# Patient Record
Sex: Female | Born: 1970 | Race: Black or African American | Hispanic: No | Smoking: Current every day smoker
Health system: Southern US, Community
[De-identification: ages and names within clinical notes are randomized; demographics above are authoritative.]

## PROBLEM LIST (undated history)

## (undated) DIAGNOSIS — F329 Major depressive disorder, single episode, unspecified: Secondary | ICD-10-CM

## (undated) DIAGNOSIS — F419 Anxiety disorder, unspecified: Secondary | ICD-10-CM

## (undated) DIAGNOSIS — D649 Anemia, unspecified: Secondary | ICD-10-CM

## (undated) DIAGNOSIS — R569 Unspecified convulsions: Secondary | ICD-10-CM

## (undated) DIAGNOSIS — F1010 Alcohol abuse, uncomplicated: Secondary | ICD-10-CM

## (undated) DIAGNOSIS — F4310 Post-traumatic stress disorder, unspecified: Secondary | ICD-10-CM

## (undated) DIAGNOSIS — F3181 Bipolar II disorder: Secondary | ICD-10-CM

## (undated) HISTORY — PX: LUNG LOBECTOMY: SHX167

## (undated) HISTORY — PX: APPENDECTOMY: SHX54

## (undated) HISTORY — PX: OTHER SURGICAL HISTORY: SHX169

## (undated) HISTORY — PX: HERNIA REPAIR: SHX51

## (undated) HISTORY — PX: ABDOMINAL HYSTERECTOMY: SHX81

---
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
## 2002-03-14 DIAGNOSIS — F419 Anxiety disorder, unspecified: Secondary | ICD-10-CM

## 2002-03-14 DIAGNOSIS — F32A Depression, unspecified: Secondary | ICD-10-CM

## 2002-03-14 HISTORY — DX: Anxiety disorder, unspecified: F41.9

## 2002-03-14 HISTORY — DX: Depression, unspecified: F32.A

## 2006-03-14 HISTORY — PX: DILATION AND CURETTAGE OF UTERUS: SHX78

## 2008-10-13 ENCOUNTER — Ambulatory Visit: Payer: Self-pay | Admitting: Radiology

## 2008-10-13 ENCOUNTER — Emergency Department (HOSPITAL_BASED_OUTPATIENT_CLINIC_OR_DEPARTMENT_OTHER): Admission: EM | Admit: 2008-10-13 | Discharge: 2008-10-13 | Payer: Self-pay | Admitting: Emergency Medicine

## 2008-11-05 ENCOUNTER — Emergency Department (HOSPITAL_BASED_OUTPATIENT_CLINIC_OR_DEPARTMENT_OTHER): Admission: EM | Admit: 2008-11-05 | Discharge: 2008-11-05 | Payer: Self-pay | Admitting: Emergency Medicine

## 2010-07-05 HISTORY — PX: GASTRIC BYPASS: SHX52

## 2010-11-17 ENCOUNTER — Encounter: Payer: Self-pay | Admitting: *Deleted

## 2010-11-17 ENCOUNTER — Emergency Department (INDEPENDENT_AMBULATORY_CARE_PROVIDER_SITE_OTHER): Payer: Self-pay

## 2010-11-17 ENCOUNTER — Emergency Department (HOSPITAL_BASED_OUTPATIENT_CLINIC_OR_DEPARTMENT_OTHER)
Admission: EM | Admit: 2010-11-17 | Discharge: 2010-11-17 | Disposition: A | Discharge status: Home / Self Care | Payer: Self-pay | Attending: Emergency Medicine | Admitting: Emergency Medicine

## 2010-11-17 DIAGNOSIS — Z9884 Bariatric surgery status: Secondary | ICD-10-CM

## 2010-11-17 DIAGNOSIS — R109 Unspecified abdominal pain: Secondary | ICD-10-CM

## 2010-11-17 DIAGNOSIS — N390 Urinary tract infection, site not specified: Secondary | ICD-10-CM

## 2010-11-17 LAB — COMPREHENSIVE METABOLIC PANEL
AST: 14 U/L (ref 0–37)
BUN: 10 mg/dL (ref 6–23)
CO2: 24 mEq/L (ref 19–32)
Calcium: 9.6 mg/dL (ref 8.4–10.5)
Chloride: 103 mEq/L (ref 96–112)
Creatinine, Ser: 0.6 mg/dL (ref 0.50–1.10)
GFR calc Af Amer: >60 mL/min (ref >60)
GFR calc non Af Amer: >60 mL/min (ref >60)
Glucose, Bld: 96 mg/dL (ref 70–99)
Total Bilirubin: 0.3 mg/dL (ref 0.3–1.2)

## 2010-11-17 LAB — URINALYSIS, ROUTINE W REFLEX MICROSCOPIC
Nitrite: POSITIVE — AB
Protein, ur: NEGATIVE mg/dL
Specific Gravity, Urine: 1.023 (ref 1.005–1.030)
Urobilinogen, UA: 1 mg/dL (ref 0.0–1.0)

## 2010-11-17 LAB — CBC
HCT: 31.9 % — ABNORMAL LOW (ref 36.0–46.0)
Hemoglobin: 10.9 g/dL — ABNORMAL LOW (ref 12.0–15.0)
MCHC: 34.2 g/dL (ref 30.0–36.0)
MCV: 85.1 fL (ref 78.0–100.0)
RDW: 16.6 % — ABNORMAL HIGH (ref 11.5–15.5)
WBC: 12.6 10*3/uL — ABNORMAL HIGH (ref 4.0–10.5)

## 2010-11-17 LAB — DIFFERENTIAL
Basophils Absolute: 0 10*3/uL (ref 0.0–0.1)
Eosinophils Relative: 1 % (ref 0–5)
Lymphocytes Relative: 19 % (ref 12–46)
Monocytes Absolute: 0.9 10*3/uL (ref 0.1–1.0)
Monocytes Relative: 7 % (ref 3–12)

## 2010-11-17 LAB — LIPASE, BLOOD: Lipase: 12 U/L (ref 11–59)

## 2010-11-17 LAB — URINE MICROSCOPIC-ADD ON

## 2010-11-17 MED ORDER — HYDROCODONE-ACETAMINOPHEN 5-325 MG PO TABS: 2.0000 | ORAL_TABLET | ORAL | Status: AC | PRN

## 2010-11-17 MED ORDER — HYDROMORPHONE HCL 1 MG/ML IJ SOLN
1.0000 mg | Freq: Once | INTRAMUSCULAR | Status: AC
  Administered 2010-11-17: 1 mg via INTRAVENOUS
  Filled 2010-11-17: qty 1

## 2010-11-17 MED ORDER — ONDANSETRON HCL 4 MG/2ML IJ SOLN
4.0000 mg | Freq: Once | INTRAMUSCULAR | Status: AC
  Administered 2010-11-17: 4 mg via INTRAVENOUS
  Filled 2010-11-17: qty 2

## 2010-11-17 MED ORDER — DIPHENHYDRAMINE HCL 50 MG/ML IJ SOLN
50.0000 mg | Freq: Once | INTRAMUSCULAR | Status: AC
  Administered 2010-11-17: 50 mg via INTRAVENOUS
  Filled 2010-11-17: qty 1

## 2010-11-17 MED ORDER — SODIUM CHLORIDE 0.9 % IV SOLN: 999.0000 mL | Freq: Once | INTRAVENOUS | Status: DC

## 2010-11-17 MED ORDER — IOHEXOL 300 MG/ML  SOLN
100.0000 mL | Freq: Once | INTRAMUSCULAR | Status: AC | PRN
  Administered 2010-11-17: 100 mL via INTRAVENOUS

## 2010-11-17 MED ORDER — SULFAMETHOXAZOLE-TRIMETHOPRIM 800-160 MG PO TABS: 1.0000 | ORAL_TABLET | Freq: Two times a day (BID) | ORAL | Status: AC

## 2010-11-17 MED ORDER — HYDROCORTISONE SOD SUCCINATE 100 MG IJ SOLR
200.0000 mg | Freq: Once | INTRAMUSCULAR | Status: AC
  Administered 2010-11-17: 200 mg via INTRAVENOUS
  Filled 2010-11-17 (×2): qty 2

## 2010-11-17 NOTE — ED Notes (Signed)
Patient states she had gastric bypass on July 05, 2010. Patient states pain in abdominal pain and n/v and denies fever.

## 2010-11-17 NOTE — ED Provider Notes (Signed)
History     CSN: [id number] Arrival date & time: 11/17/2010 10:35 AM  Chief Complaint  Patient presents with  . Abdominal Pain   Patient is a 40 y.o. female presenting with abdominal pain. The history is provided by the patient.  Abdominal Pain The primary symptoms of the illness include abdominal pain. The current episode started 2 days ago. The onset of the illness was sudden. The problem has been gradually worsening.  The illness is associated with a recent illness. The patient states that she believes she is currently not pregnant. Risk factors for an acute abdominal problem include a history of abdominal surgery. Additional symptoms associated with the illness include chills, anorexia and urgency. Significant associated medical issues do not include PUD, GERD, inflammatory bowel disease, diabetes or gallstones.  Pt complains of upper abdominal pain. Pt reports she had a a gastric bypass in April in [place].    History reviewed. No pertinent past medical history.  Past Surgical History  Procedure Date  . Appendectomy   . C sections x 2   . Gastric bypass July 05, 2010  . Hernia repair   . Tonsillectomy     History reviewed. No pertinent family history.  History  Substance Use Topics  . Smoking status: Smoker, Current Status Unknown  . Smokeless tobacco: Never Used  . Alcohol Use: No    OB History    Grav Para Term Preterm Abortions TAB SAB Ect Mult Living                  Review of Systems  Constitutional: Positive for chills.  Gastrointestinal: Positive for abdominal pain and anorexia.  Genitourinary: Positive for urgency.  All other systems reviewed and are negative.    Physical Exam  BP 115/71  Pulse 100  Temp(Src) 97.9 F (36.6 C) (Oral)  Resp 20  Ht 5\' 9"  (1.753 m)  Wt 182 lb 9 oz (82.81 kg)  BMI 26.96 kg/m2  SpO2 99%  LMP 10/21/2010  Physical Exam  Nursing note and vitals reviewed. Constitutional: She is oriented to person, place, and time. She  appears well-developed and well-nourished.  HENT:  Head: Normocephalic and atraumatic.  Eyes: Conjunctivae and EOM are normal. Pupils are equal, round, and reactive to light.  Neck: Normal range of motion. Neck supple.  Cardiovascular: Normal rate.   Abdominal: Soft. There is tenderness. There is guarding.  Musculoskeletal: Normal range of motion.  Neurological: She is alert and oriented to person, place, and time.  Skin: Skin is warm and dry.  Psychiatric: She has a normal mood and affect.    ED Course  Procedures  MDM Ct no acute abnormality,  Pt has uti.  Dr. Fredricka Bonine in to see and exam pt.  Pt given rx for hydrocodone and bactrim.  Pt is advised to see her MD for recheck,.      Langston Masker, [place] 11/17/10 1527

## 2010-11-29 NOTE — ED Provider Notes (Signed)
Evaluation and management procedures were performed by the PA/NP under my supervision/collaboration.    Delano Frate D Nashalie Sallis, MD 11/29/10 1308 

## 2012-01-23 ENCOUNTER — Encounter (HOSPITAL_COMMUNITY): Payer: Self-pay | Admitting: *Deleted

## 2012-01-23 ENCOUNTER — Emergency Department (HOSPITAL_COMMUNITY)
Admission: EM | Admit: 2012-01-23 | Discharge: 2012-01-26 | Disposition: A | Discharge status: Other Acute Inpatient Hospital | Attending: Emergency Medicine | Admitting: Emergency Medicine

## 2012-01-23 DIAGNOSIS — F1910 Other psychoactive substance abuse, uncomplicated: Secondary | ICD-10-CM | POA: Diagnosis present

## 2012-01-23 DIAGNOSIS — D649 Anemia, unspecified: Secondary | ICD-10-CM

## 2012-01-23 DIAGNOSIS — Z9884 Bariatric surgery status: Secondary | ICD-10-CM

## 2012-01-23 DIAGNOSIS — Z531 Procedure and treatment not carried out because of patient's decision for reasons of belief and group pressure: Secondary | ICD-10-CM | POA: Insufficient documentation

## 2012-01-23 DIAGNOSIS — F329 Major depressive disorder, single episode, unspecified: Secondary | ICD-10-CM | POA: Diagnosis present

## 2012-01-23 DIAGNOSIS — IMO0001 Reserved for inherently not codable concepts without codable children: Secondary | ICD-10-CM

## 2012-01-23 DIAGNOSIS — D509 Iron deficiency anemia, unspecified: Secondary | ICD-10-CM | POA: Diagnosis present

## 2012-01-23 DIAGNOSIS — F32A Depression, unspecified: Secondary | ICD-10-CM | POA: Diagnosis present

## 2012-01-23 DIAGNOSIS — F1420 Cocaine dependence, uncomplicated: Secondary | ICD-10-CM | POA: Insufficient documentation

## 2012-01-23 DIAGNOSIS — F3289 Other specified depressive episodes: Secondary | ICD-10-CM | POA: Insufficient documentation

## 2012-01-23 LAB — CBC WITH DIFFERENTIAL/PLATELET
Eosinophils Relative: 2 % (ref 0–5)
Lymphocytes Relative: 36 % (ref 12–46)
Monocytes Absolute: 0.6 10*3/uL (ref 0.1–1.0)
Monocytes Relative: 8 % (ref 3–12)
Neutrophils Relative %: 53 % (ref 43–77)
Platelets: 838 10*3/uL — ABNORMAL HIGH (ref 150–400)
RBC: 3.44 MIL/uL — ABNORMAL LOW (ref 3.87–5.11)
WBC: 7.1 10*3/uL (ref 4.0–10.5)

## 2012-01-23 LAB — URINALYSIS, ROUTINE W REFLEX MICROSCOPIC
Nitrite: POSITIVE — AB
Specific Gravity, Urine: 1.022 (ref 1.005–1.030)
Urobilinogen, UA: 1 mg/dL (ref 0.0–1.0)

## 2012-01-23 LAB — URINE MICROSCOPIC-ADD ON

## 2012-01-23 LAB — BASIC METABOLIC PANEL
BUN: 8 mg/dL (ref 6–23)
GFR calc Af Amer: >90 mL/min (ref >90)
GFR calc non Af Amer: >90 mL/min (ref >90)
Potassium: 3.7 mEq/L (ref 3.5–5.1)
Sodium: 138 mEq/L (ref 135–145)

## 2012-01-23 LAB — RAPID URINE DRUG SCREEN, HOSP PERFORMED
Barbiturates: NOT DETECTED
Opiates: NOT DETECTED
Tetrahydrocannabinol: POSITIVE — AB

## 2012-01-23 LAB — OCCULT BLOOD, POC DEVICE: Fecal Occult Bld: NEGATIVE

## 2012-01-23 NOTE — ED Notes (Signed)
Pt is in blue scrubs  And has been wanded

## 2012-01-23 NOTE — ED Notes (Signed)
ACT at bedside 

## 2012-01-23 NOTE — ED Notes (Signed)
Patient states SI with no plan. Intentionally puts herself into situations where she could be killed. Patient states that she "wants someone else to do it".

## 2012-01-23 NOTE — ED Notes (Signed)
Patients purse sent home with (Alicia Zamora) family friend. This is per patient request. Patient personally handed purse to [place]. All actions witnessed by sitter. Patient would like Suzette Battiest to be her emergency contact [phone]

## 2012-01-23 NOTE — ED Notes (Signed)
Pt is here with suicidal thoughts and homocidal thoughts.  PT reports starvation, use of crack cocaine, isolation.  No ingestion or self harm physical

## 2012-01-24 LAB — HEMOGLOBIN AND HEMATOCRIT, BLOOD: Hemoglobin: 7 g/dL — ABNORMAL LOW (ref 12.0–15.0)

## 2012-01-24 MED ORDER — URSODIOL 500 MG PO TABS: 500.0000 mg | ORAL_TABLET | Freq: Two times a day (BID) | ORAL | Status: DC

## 2012-01-24 MED ORDER — BUSPIRONE HCL 15 MG PO TABS
15.0000 mg | ORAL_TABLET | Freq: Three times a day (TID) | ORAL | Status: DC | PRN
  Filled 2012-01-24: qty 1

## 2012-01-24 MED ORDER — PANTOPRAZOLE SODIUM 40 MG PO TBEC: 80.0000 mg | DELAYED_RELEASE_TABLET | Freq: Every day | ORAL | Status: DC

## 2012-01-24 MED ORDER — PANTOPRAZOLE SODIUM 40 MG PO TBEC
40.0000 mg | DELAYED_RELEASE_TABLET | Freq: Every day | ORAL | Status: DC
  Administered 2012-01-24: 40 mg via ORAL
  Filled 2012-01-24: qty 1

## 2012-01-24 MED ORDER — ONE-DAILY MULTI VITAMINS PO TABS: 1.0000 | ORAL_TABLET | Freq: Two times a day (BID) | ORAL | Status: DC

## 2012-01-24 MED ORDER — DIAZEPAM 5 MG PO TABS
10.0000 mg | ORAL_TABLET | Freq: Three times a day (TID) | ORAL | Status: DC | PRN
  Administered 2012-01-24 – 2012-01-26 (×4): 10 mg via ORAL
  Filled 2012-01-24: qty 1
  Filled 2012-01-24 (×3): qty 2
  Filled 2012-01-24: qty 1

## 2012-01-24 MED ORDER — FERROUS SULFATE 325 (65 FE) MG PO TABS
325.0000 mg | ORAL_TABLET | Freq: Three times a day (TID) | ORAL | Status: DC
  Administered 2012-01-24 – 2012-01-26 (×6): 325 mg via ORAL
  Filled 2012-01-24 (×12): qty 1

## 2012-01-24 MED ORDER — TEMAZEPAM 7.5 MG PO CAPS
7.5000 mg | ORAL_CAPSULE | Freq: Every evening | ORAL | Status: DC | PRN
  Administered 2012-01-24 – 2012-01-25 (×2): 7.5 mg via ORAL
  Filled 2012-01-24 (×2): qty 1

## 2012-01-24 MED ORDER — CIPROFLOXACIN HCL 500 MG PO TABS
500.0000 mg | ORAL_TABLET | Freq: Once | ORAL | Status: AC
  Administered 2012-01-24: 500 mg via ORAL
  Filled 2012-01-24: qty 1

## 2012-01-24 MED ORDER — ADULT MULTIVITAMIN W/MINERALS CH
1.0000 | ORAL_TABLET | Freq: Two times a day (BID) | ORAL | Status: DC
  Administered 2012-01-24 – 2012-01-26 (×5): 1 via ORAL
  Filled 2012-01-24 (×5): qty 1

## 2012-01-24 MED ORDER — URSODIOL 500 MG PO TABS
500.0000 mg | ORAL_TABLET | Freq: Two times a day (BID) | ORAL | Status: DC
  Filled 2012-01-24 (×2): qty 1

## 2012-01-24 MED ORDER — VITAMIN B-1 100 MG PO TABS
50.0000 mg | ORAL_TABLET | Freq: Every day | ORAL | Status: DC
  Administered 2012-01-24 – 2012-01-26 (×3): 50 mg via ORAL
  Filled 2012-01-24 (×3): qty 1

## 2012-01-24 MED ORDER — PANTOPRAZOLE SODIUM 40 MG PO TBEC
40.0000 mg | DELAYED_RELEASE_TABLET | Freq: Two times a day (BID) | ORAL | Status: DC
  Administered 2012-01-24 – 2012-01-26 (×5): 40 mg via ORAL
  Filled 2012-01-24 (×5): qty 1

## 2012-01-24 MED ORDER — VITAMIN D (ERGOCALCIFEROL) 1.25 MG (50000 UNIT) PO CAPS: 50000.0000 [IU] | ORAL_CAPSULE | ORAL | Status: DC

## 2012-01-24 MED ORDER — VITAMIN B-12 1000 MCG PO TABS
1000.0000 ug | ORAL_TABLET | ORAL | Status: DC
  Administered 2012-01-26: 1000 ug via ORAL
  Filled 2012-01-24: qty 1

## 2012-01-24 MED ORDER — VITAMIN B-12 1000 MCG/15ML PO LIQD: 15.0000 mL | ORAL | Status: DC

## 2012-01-24 MED ORDER — CALCIUM CITRATE 950 (200 CA) MG PO TABS
1.0000 | ORAL_TABLET | Freq: Two times a day (BID) | ORAL | Status: DC
  Administered 2012-01-24 – 2012-01-26 (×5): 200 mg via ORAL
  Filled 2012-01-24 (×7): qty 1

## 2012-01-24 NOTE — ED Notes (Signed)
telepsych completed 
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
## 2012-01-24 NOTE — ED Notes (Signed)
Pt complaining of acid reflux, hx of the same, med was last given at 1245 this afternoon, EDP is aware and will give pt a dose at this time. Pt requesting something to help her sleep. Will give PRN meds as ordered. Sitter is at bedside. Pt is calm and cooperative and pleasant. Pt in scrubs. Area is safe. Pt watching tv with no other needs.

## 2012-01-24 NOTE — ED Notes (Signed)
Pt sleeping, NAD, calm, door open, pt visualized.

## 2012-01-24 NOTE — ED Notes (Addendum)
Sleeping, NAD, calm, door open, pt visualized.

## 2012-01-24 NOTE — ED Notes (Signed)
Bed switched out and pt given hospital bed for comfort. Pt eating at this time.

## 2012-01-24 NOTE — ED Provider Notes (Signed)
Pt had telepsych consult by Dr Jacky Kindle who feels patient has major depressive disorder, recurrent, severe without psychotic behavior and recommends inpatient psychiatric care. Pt relates multiple deaths in her family. No medication recommendation given.   Ward Givens, MD 01/24/12 [phone]

## 2012-01-24 NOTE — ED Notes (Signed)
ACT team inquiring about anemia and UTI.  Chart reviewed.  It does not appear that patient was started on any antibiotics as of yet, urine culture sent.  Allergies reviewed- PCN allergic, so started on cipro, will need BID x 3 days.  Pt anemic- hemoccult negative.  Does have GERD and is requesting her protonix to be given BID- this was ordered.  Also ordered ferrous sulfate TID.  Pt states she has hx of anemia due to prior gastric bypass.  She is not tachycardic or orthostatic.    Ethelda Chick, MD 01/24/12 [phone]

## 2012-01-24 NOTE — ED Notes (Addendum)
Sleeping, NAD, calm, door open, pt visualized. 

## 2012-01-24 NOTE — ED Notes (Signed)
Pt aaox4 ate breakfast and then up to shower states has special needs  For food which was requested for lunch

## 2012-01-24 NOTE — ED Notes (Signed)
Pt awake, given sprite per request, alert, NAD, calm, interactive, polite, pleasant, updated.

## 2012-01-25 DIAGNOSIS — Z531 Procedure and treatment not carried out because of patient's decision for reasons of belief and group pressure: Secondary | ICD-10-CM

## 2012-01-25 DIAGNOSIS — D509 Iron deficiency anemia, unspecified: Secondary | ICD-10-CM | POA: Diagnosis present

## 2012-01-25 DIAGNOSIS — F329 Major depressive disorder, single episode, unspecified: Secondary | ICD-10-CM

## 2012-01-25 DIAGNOSIS — Z9884 Bariatric surgery status: Secondary | ICD-10-CM

## 2012-01-25 DIAGNOSIS — D649 Anemia, unspecified: Secondary | ICD-10-CM

## 2012-01-25 DIAGNOSIS — F1910 Other psychoactive substance abuse, uncomplicated: Secondary | ICD-10-CM | POA: Diagnosis present

## 2012-01-25 LAB — RETICULOCYTES: Retic Count, Absolute: 30.1 10*3/uL (ref 19.0–186.0)

## 2012-01-25 LAB — CBC
HCT: 24 % — ABNORMAL LOW (ref 36.0–46.0)
Hemoglobin: 7.2 g/dL — ABNORMAL LOW (ref 12.0–15.0)
MCHC: 30 g/dL (ref 30.0–36.0)
RBC: 3.31 MIL/uL — ABNORMAL LOW (ref 3.87–5.11)

## 2012-01-25 LAB — IRON AND TIBC
Iron: <10 ug/dL — ABNORMAL LOW (ref 42–135)
UIBC: 326 ug/dL (ref 125–400)

## 2012-01-25 LAB — URINE CULTURE

## 2012-01-25 LAB — VITAMIN B12: Vitamin B-12: 342 pg/mL (ref 211–911)

## 2012-01-25 MED ORDER — DOCUSATE SODIUM 100 MG PO CAPS
100.0000 mg | ORAL_CAPSULE | Freq: Two times a day (BID) | ORAL | Status: DC
  Filled 2012-01-25 (×2): qty 1

## 2012-01-25 MED ORDER — SODIUM CHLORIDE 0.9 % IV SOLN
1000.0000 mg | Freq: Once | INTRAVENOUS | Status: AC
  Administered 2012-01-25: 1000 mg via INTRAVENOUS
  Filled 2012-01-25 (×2): qty 20

## 2012-01-25 MED ORDER — DOCUSATE SODIUM 100 MG PO CAPS
100.0000 mg | ORAL_CAPSULE | Freq: Two times a day (BID) | ORAL | Status: DC
  Administered 2012-01-25 – 2012-01-26 (×2): 100 mg via ORAL
  Filled 2012-01-25 (×2): qty 1

## 2012-01-25 MED ORDER — SODIUM CHLORIDE 0.9 % IV SOLN
25.0000 mg | Freq: Once | INTRAVENOUS | Status: AC
  Administered 2012-01-25: 25 mg via INTRAVENOUS
  Filled 2012-01-25: qty 0.5

## 2012-01-25 NOTE — ED Notes (Signed)
Pt is a Fish farm manager witness, therefore she will not consent to blood products or type and screen.

## 2012-01-25 NOTE — BH Assessment (Deleted)
[facility] Assessment Progress Note      Refaxed referral to [facility].  Spoke to [facility] [place] who reports they have female general psych beds.  Faxed referral.

## 2012-01-25 NOTE — ED Provider Notes (Signed)
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 [facility] concerned about low Hgb, although patient is heme positive and essentially asymptomatic. She is also a Jehovah's Witness and will not accept blood products. Will send Anemia Panel and request formal Hospitalist Consult to facilitate placement.   Charles B. Bernette Mayers, MD 01/25/12 [phone]

## 2012-01-25 NOTE — ED Notes (Signed)
Waiting for iron infusion to arrive from pharmacy. No needs at this time. Sitter remains at bedside. IV access obtained.

## 2012-01-25 NOTE — Consult Note (Signed)
[facility] Medical Consultation  Alicia Zamora VQQ:[id number] DOB: 02-15-1971 DOA: 01/23/2012 PCP: Sheila Oats, MD   Requesting physician: Bonnita Levan. Bernette Mayers Date of consultation: 01/25/2012 Reason for consultation: Anemia  Impression/Recommendations Active Problems:  Anemia  Refusal of blood transfusions as patient is Jehovah's Witness  S/P gastric bypass  Depression   Anemia -Patient has a history of gastric bypass surgery, these patients usually have high risk of nutritional deficiencies. -Other reasons can cause hypochromic microcytic anemia is chronic blood loss. -Patient already has negative FOBT. -Anemia panel is pending, If the ferritin and iron are low I will probably give IV iron. -Patient is Jehovah's Witness and she refused blood products transfusion. -Patient is mildly symptomatic from her anemia, suggesting chronicity of her anemia.  Depression -[facility] is [facility].   I will followup again tomorrow. Please contact me if I can be of assistance in the meanwhile. Thank you for this consultation.  Chief Complaint:  Anemia   HPI:   Alicia Zamora is a 41 year old African American female with past medical history of morbid obesity status post gastric bypass surgery in April of 2012. Patient came in to the hospital because of severe depression and homicidal thoughts. Patient was held in the emergency department and seen by psychiatry and recommended inpatient psychiatric placement. While she was in the emergency department her blood work showed significant anemia, patient mentioned also that she'll occasional dizziness. She denies any chest pain, shortness of breath or fatigability. Her hemoglobin was 10.9 on 11/17/2010. Hemoglobin tested to 7.7 the day before yesterday, and 7.0 yesterday. [facility] hospital[facility] being consulted for further management, please note this patient is Jehovah's Witness and she refused any  blood products transfusion.  Review of Systems:  Constitutional: Complaining about occasional dizziness  Eyes: negative for irritation, redness and visual disturbance Ears, nose, mouth, throat, and face: negative for earaches, epistaxis, nasal congestion and sore throat Respiratory: negative for cough, dyspnea on exertion, sputum and wheezing Cardiovascular: negative for chest pain, dyspnea, lower extremity edema, orthopnea, palpitations and syncope Gastrointestinal: negative for abdominal pain, constipation, diarrhea, melena, nausea and vomiting Genitourinary:negative for dysuria, frequency and hematuria Hematologic/lymphatic: negative for bleeding, easy bruising and lymphadenopathy Musculoskeletal:negative for arthralgias, muscle weakness and stiff joints Neurological: negative for coordination problems, gait problems, headaches and weakness Endocrine: negative for diabetic symptoms including polydipsia, polyuria and weight loss Allergic/Immunologic: negative for anaphylaxis, hay fever and urticaria   History reviewed. No pertinent past medical history. Past Surgical History  Procedure Date  . Appendectomy   . C sections x 2   . Gastric bypass July 05, 2010  . Hernia repair   . Tonsillectomy    Social History:  reports that she has been smoking.  She has never used smokeless tobacco. She reports that she uses illicit drugs. She reports that she does not drink alcohol.  Allergies  Allergen Reactions  . Iodine Anaphylaxis  . Penicillins Anaphylaxis  . Ketorolac Tromethamine Hives  . Nsaids     Due to gastric bypass   No family history of colon cancer  Prior to Admission medications   Medication Sig Start Date End Date Taking? Authorizing Provider  busPIRone (BUSPAR) 15 MG tablet Take 15 mg by mouth 3 (three) times daily as needed. For anxiety   Yes Historical Provider, MD  calcium citrate (CALCITRATE - DOSED IN MG ELEMENTAL CALCIUM) 950 MG tablet Take 1 tablet by mouth 2  (two) times daily.    Yes Historical Provider, MD  Cyanocobalamin (  VITAMIN B-12) 1000 MCG/15ML LIQD Take 15 mLs by mouth 2 (two) times a week.   Yes Historical Provider, MD  diazepam (VALIUM) 10 MG tablet Take 10 mg by mouth every 6 (six) hours as needed. For anxiety   Yes Historical Provider, MD  esomeprazole (NEXIUM) 40 MG capsule Take 40 mg by mouth 2 (two) times daily.    Yes Historical Provider, MD  Multiple Vitamin (MULTIVITAMIN) tablet Take 1 tablet by mouth 2 (two) times daily.    Yes Historical Provider, MD  temazepam (RESTORIL) 7.5 MG capsule Take 7.5 mg by mouth at bedtime as needed. For insomnia   Yes Historical Provider, MD  thiamine (VITAMIN B-1) 50 MG tablet Take 50 mg by mouth daily.   Yes Historical Provider, MD  ursodiol (ACTIGALL) 500 MG tablet Take 500 mg by mouth 2 (two) times daily.   Yes Historical Provider, MD  Vitamin D, Ergocalciferol, (DRISDOL) 50000 UNITS CAPS Take 50,000 Units by mouth 2 (two) times a week. mon & fri   Yes Historical Provider, MD   Physical Exam: Blood pressure 111/48, pulse 80, temperature 98.3 F (36.8 C), temperature source Oral, resp. rate 16, last menstrual period 01/11/2012, SpO2 100.00%. Filed Vitals:   01/24/12 2142 01/24/12 2145 01/24/12 2146 01/25/12 0632  BP: 105/63 114/71 104/62 111/48  Pulse: 100 104 105 80  Temp:    98.3 F (36.8 C)  TempSrc:    Oral  Resp:    16  SpO2:    100%   General appearance: alert, cooperative and no distress  Head: Normocephalic, without obvious abnormality, atraumatic  Eyes: conjunctivae/corneas clear. PERRL, EOM's intact. Fundi benign.  Nose: Nares normal. Septum midline. Mucosa normal. No drainage or sinus tenderness.  Throat: lips, mucosa, and tongue normal; teeth and gums normal  Neck: Supple, no masses, no cervical lymphadenopathy, no JVD appreciated, no meningeal signs Resp: clear to auscultation bilaterally  Chest wall: no tenderness  Cardio: regular rate and rhythm, S1, S2 normal, no murmur,  click, rub or gallop  GI: soft, non-tender; bowel sounds normal; no masses, no organomegaly  Extremities: extremities normal, atraumatic, no cyanosis or edema  Skin: Skin color, texture, turgor normal. No rashes or lesions  Neurologic: Alert and oriented X 3, normal strength and tone. Normal symmetric reflexes. Normal coordination and gait  Labs on Admission:  Basic Metabolic Panel:  Lab 01/23/12 1610  NA 138  K 3.7  CL 102  CO2 27  GLUCOSE 105*  BUN 8  CREATININE 0.62  CALCIUM 8.9  MG --  PHOS --   Liver Function Tests: No results found for this basename: AST:5,ALT:5,ALKPHOS:5,BILITOT:5,PROT:5,ALBUMIN:5 in the last 168 hours No results found for this basename: LIPASE:5,AMYLASE:5 in the last 168 hours No results found for this basename: AMMONIA:5 in the last 168 hours CBC:  Lab 01/24/12 2129 01/23/12 1815  WBC -- 7.1  NEUTROABS -- 3.7  HGB 7.0* 7.7*  HCT 23.0* 25.1*  MCV -- 73.0*  PLT -- 838*   Cardiac Enzymes: No results found for this basename: CKTOTAL:5,CKMB:5,CKMBINDEX:5,TROPONINI:5 in the last 168 hours BNP: No components found with this basename: POCBNP:5 CBG: No results found for this basename: GLUCAP:5 in the last 168 hours  Radiological Exams on Admission: No results found.  Time spent: 70 minutes  [facility] Pager [phone]  If 7PM-7AM, please contact night-coverage [email or web address] Password [email or web address] 01/25/2012, 10:40 AM

## 2012-01-25 NOTE — BH Assessment (Signed)
[facility] Assessment Progress Note      Alicia Zamora at [facility] wants pt to have an anemia work up prior to further review.  Attempted to contact [facility] for referral-no answer.  Spoke to [facility] at [place] who reports they have female general psych beds. Faxed referral.

## 2012-01-25 NOTE — ED Provider Notes (Signed)
Please see Dr Janese Banks note.  Medicine consult for her anemia.   VSS.  Pt voices no concerns this am.  Celene Kras, MD 01/25/12 [phone]

## 2012-01-25 NOTE — ED Notes (Signed)
Visitors at bedside.
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
## 2012-01-26 ENCOUNTER — Encounter (HOSPITAL_COMMUNITY): Payer: Self-pay | Admitting: *Deleted

## 2012-01-26 ENCOUNTER — Inpatient Hospital Stay (HOSPITAL_COMMUNITY)
Admission: AD | Admit: 2012-01-26 | Discharge: 2012-01-31 | DRG: 881 | Disposition: A | Discharge status: Home / Self Care | Source: Ambulatory Visit | Attending: Psychiatry | Admitting: Psychiatry

## 2012-01-26 DIAGNOSIS — F1210 Cannabis abuse, uncomplicated: Secondary | ICD-10-CM | POA: Diagnosis present

## 2012-01-26 DIAGNOSIS — F1994 Other psychoactive substance use, unspecified with psychoactive substance-induced mood disorder: Secondary | ICD-10-CM | POA: Diagnosis present

## 2012-01-26 DIAGNOSIS — Z79899 Other long term (current) drug therapy: Secondary | ICD-10-CM

## 2012-01-26 DIAGNOSIS — F1910 Other psychoactive substance abuse, uncomplicated: Secondary | ICD-10-CM

## 2012-01-26 DIAGNOSIS — D509 Iron deficiency anemia, unspecified: Secondary | ICD-10-CM

## 2012-01-26 DIAGNOSIS — F329 Major depressive disorder, single episode, unspecified: Principal | ICD-10-CM | POA: Diagnosis present

## 2012-01-26 DIAGNOSIS — Z531 Procedure and treatment not carried out because of patient's decision for reasons of belief and group pressure: Secondary | ICD-10-CM

## 2012-01-26 DIAGNOSIS — Z9884 Bariatric surgery status: Secondary | ICD-10-CM

## 2012-01-26 DIAGNOSIS — F3289 Other specified depressive episodes: Principal | ICD-10-CM | POA: Diagnosis present

## 2012-01-26 DIAGNOSIS — F4310 Post-traumatic stress disorder, unspecified: Secondary | ICD-10-CM | POA: Diagnosis present

## 2012-01-26 HISTORY — DX: Anemia, unspecified: D64.9

## 2012-01-26 HISTORY — DX: Major depressive disorder, single episode, unspecified: F32.9

## 2012-01-26 HISTORY — DX: Anxiety disorder, unspecified: F41.9

## 2012-01-26 HISTORY — DX: Bipolar II disorder: F31.81

## 2012-01-26 LAB — CBC
HCT: 23.1 % — ABNORMAL LOW (ref 36.0–46.0)
Hemoglobin: 7 g/dL — ABNORMAL LOW (ref 12.0–15.0)
MCH: 22.1 pg — ABNORMAL LOW (ref 26.0–34.0)
MCV: 72.9 fL — ABNORMAL LOW (ref 78.0–100.0)
Platelets: 562 10*3/uL — ABNORMAL HIGH (ref 150–400)
RBC: 3.17 MIL/uL — ABNORMAL LOW (ref 3.87–5.11)
WBC: 9.5 10*3/uL (ref 4.0–10.5)

## 2012-01-26 MED ORDER — VITAMIN B-1 50 MG PO TABS
50.0000 mg | ORAL_TABLET | Freq: Every day | ORAL | Status: DC
  Filled 2012-01-26 (×2): qty 1

## 2012-01-26 MED ORDER — ACETAMINOPHEN 325 MG PO TABS
650.0000 mg | ORAL_TABLET | Freq: Four times a day (QID) | ORAL | Status: DC | PRN
  Administered 2012-01-28 (×2): 650 mg via ORAL

## 2012-01-26 MED ORDER — OXYCODONE HCL 5 MG PO TABS
5.0000 mg | ORAL_TABLET | Freq: Once | ORAL | Status: AC
  Administered 2012-01-26: 5 mg via ORAL
  Filled 2012-01-26: qty 1

## 2012-01-26 MED ORDER — VITAMIN D (ERGOCALCIFEROL) 1.25 MG (50000 UNIT) PO CAPS: 50000.0000 [IU] | ORAL_CAPSULE | ORAL | Status: DC

## 2012-01-26 MED ORDER — CALCIUM CITRATE 950 (200 CA) MG PO TABS
200.0000 mg | ORAL_TABLET | Freq: Two times a day (BID) | ORAL | Status: DC
  Filled 2012-01-26 (×3): qty 1

## 2012-01-26 MED ORDER — MAGNESIUM HYDROXIDE 400 MG/5ML PO SUSP
30.0000 mL | Freq: Every day | ORAL | Status: DC | PRN
  Administered 2012-01-27: 30 mL via ORAL

## 2012-01-26 MED ORDER — URSODIOL 250 MG PO TABS
500.0000 mg | ORAL_TABLET | Freq: Two times a day (BID) | ORAL | Status: DC
  Administered 2012-01-26: 500 mg via ORAL
  Filled 2012-01-26 (×5): qty 2

## 2012-01-26 MED ORDER — TAB-A-VITE/IRON PO TABS
1.0000 | ORAL_TABLET | Freq: Two times a day (BID) | ORAL | Status: DC
  Administered 2012-01-27 – 2012-01-30 (×9): 1 via ORAL
  Filled 2012-01-26 (×15): qty 1

## 2012-01-26 MED ORDER — ALUM & MAG HYDROXIDE-SIMETH 200-200-20 MG/5ML PO SUSP: 30.0000 mL | ORAL | Status: DC | PRN

## 2012-01-26 MED ORDER — PANTOPRAZOLE SODIUM 40 MG PO TBEC
40.0000 mg | DELAYED_RELEASE_TABLET | Freq: Two times a day (BID) | ORAL | Status: DC
  Administered 2012-01-27 – 2012-01-31 (×9): 40 mg via ORAL
  Filled 2012-01-26 (×14): qty 1

## 2012-01-26 MED ORDER — BUSPIRONE HCL 10 MG PO TABS
15.0000 mg | ORAL_TABLET | Freq: Three times a day (TID) | ORAL | Status: DC | PRN
  Administered 2012-01-26 – 2012-01-31 (×7): 15 mg via ORAL
  Filled 2012-01-26: qty 1
  Filled 2012-01-26: qty 2
  Filled 2012-01-26 (×2): qty 1
  Filled 2012-01-26: qty 2
  Filled 2012-01-26: qty 1
  Filled 2012-01-26 (×2): qty 2

## 2012-01-26 MED ORDER — TEMAZEPAM 7.5 MG PO CAPS
7.5000 mg | ORAL_CAPSULE | Freq: Every evening | ORAL | Status: DC | PRN
  Administered 2012-01-26 – 2012-01-31 (×5): 7.5 mg via ORAL
  Filled 2012-01-26 (×5): qty 1

## 2012-01-26 MED ORDER — VITAMIN B-12 1000 MCG PO TABS: 1000.0000 ug | ORAL_TABLET | ORAL | Status: DC

## 2012-01-26 MED ORDER — OXYCODONE HCL 5 MG PO TABS
5.0000 mg | ORAL_TABLET | Freq: Three times a day (TID) | ORAL | Status: DC | PRN
  Administered 2012-01-26 – 2012-01-30 (×13): 5 mg via ORAL
  Filled 2012-01-26 (×13): qty 1

## 2012-01-26 MED ORDER — LORAZEPAM 1 MG PO TABS
1.0000 mg | ORAL_TABLET | Freq: Four times a day (QID) | ORAL | Status: DC | PRN
  Administered 2012-01-26 – 2012-01-31 (×13): 1 mg via ORAL
  Filled 2012-01-26 (×13): qty 1
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
## 2012-01-26 NOTE — ED Provider Notes (Signed)
Patient has been seen by medicine for her anemia. His been stable recently. She has a very low iron. She's been infused IV iron. She's been cleared by medicine for psychiatric placement. [facility] has accepted the patient pending bed availability  Juliet Rude. Rubin Payor, MD 01/26/12 1422

## 2012-01-26 NOTE — ED Notes (Signed)
Pt up to bathroom accompanied by sitter.
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
## 2012-01-26 NOTE — Progress Notes (Signed)
[facility] PROGRESS NOTE  Alicia Zamora ZOX:[id number] DOB: Mar 22, 1970 DOA: 01/23/2012 PCP: Sheila Oats, MD  Recommendations: -Patient is stable from internal medicine standpoint for transfer to the behavioral health hospital. -Check CBC in one week. Recommend no exercise until the hemoglobin is more than 8.0. -Start Ferrous sulfate 325 mg by mouth 3 times a day. -Multivitamin 1 tablet by mouth daily -Continue vitamin D, thiamine and B12 supplementations.  HPI/Subjective: Complains about headache, she also have some pain in the back of her neck.   Assessment/Plan:  Iron deficiency anemia -Patient has severe iron deficiency, ferritin of 3, virtually she has no iron stores in her body. -Total of 1025 mg of IV iron given. She needs oral iron supplementation on discharge. -Hemoglobin of 7, stable very minimal symptoms with dizziness. -She is a Jehovah's Witness she is very hesitant to get blood.  Headache -One dose of 5 mg of OxyIR will be given.  Depression -Per primary service and psychiatry, patient to inpatient psychiatry ward.   Disposition Plan: Behavioral health, when bed is available   Consultants:  Internal medicine.  Psychiatry  Procedures:  None  Antibiotics:  None   Objective: Filed Vitals:   01/25/12 1212 01/25/12 1541 01/25/12 2239 01/26/12 0610  BP: 104/60 107/54 110/57 93/57  Pulse: 96 95 94 71  Temp: 97.9 F (36.6 C) 98.5 F (36.9 C) 98.6 F (37 C) 97.8 F (36.6 C)  TempSrc: Oral Oral Oral Oral  Resp: 18  17 18   SpO2: 100% 100% 100% 100%   No intake or output data in the 24 hours ending 01/26/12 1039 There were no vitals filed for this visit.  Exam:  General: Alert and awake, oriented x3, not in any acute distress. HEENT: anicteric sclera, pupils reactive to light and accommodation, EOMI CVS: S1-S2 clear, no murmur rubs or gallops Chest: clear to auscultation bilaterally, no wheezing, rales or rhonchi Abdomen: soft  nontender, nondistended, normal bowel sounds, no organomegaly Extremities: no cyanosis, clubbing or edema noted bilaterally Neuro: Cranial nerves II-XII intact, no focal neurological deficits  Data Reviewed: Basic Metabolic Panel:  Lab 01/23/12 8119  NA 138  K 3.7  CL 102  CO2 27  GLUCOSE 105*  BUN 8  CREATININE 0.62  CALCIUM 8.9  MG --  PHOS --   Liver Function Tests: No results found for this basename: AST:5,ALT:5,ALKPHOS:5,BILITOT:5,PROT:5,ALBUMIN:5 in the last 168 hours No results found for this basename: LIPASE:5,AMYLASE:5 in the last 168 hours No results found for this basename: AMMONIA:5 in the last 168 hours CBC:  Lab 01/26/12 0431 01/25/12 1144 01/24/12 2129 01/23/12 1815  WBC 9.5 9.4 -- 7.1  NEUTROABS -- -- -- 3.7  HGB 7.0* 7.2* 7.0* 7.7*  HCT 23.1* 24.0* 23.0* 25.1*  MCV 72.9* 72.5* -- 73.0*  PLT 562* 678* -- 838*   Cardiac Enzymes: No results found for this basename: CKTOTAL:5,CKMB:5,CKMBINDEX:5,TROPONINI:5 in the last 168 hours BNP (last 3 results) No results found for this basename: PROBNP:3 in the last 8760 hours CBG: No results found for this basename: GLUCAP:5 in the last 168 hours  Recent Results (from the past 240 hour(s))  URINE CULTURE     Status: Normal   Collection Time   01/23/12 10:11 PM      Component Value Range Status Comment   Specimen Description URINE, CLEAN CATCH   Final    Special Requests NONE   Final    Culture  Setup Time 01/23/2012 23:04   Final    Colony Count >=100,000 COLONIES/ML   Final  Culture     Final    Value: DIPHTHEROIDS(CORYNEBACTERIUM SPECIES)     Note: Standardized susceptibility testing for this organism is not available.   Report Status 01/25/2012 FINAL   Final      Studies: No results found.  Scheduled Meds:   . calcium citrate  1 tablet Oral BID  . docusate sodium  100 mg Oral BID  . ferrous sulfate  325 mg Oral TID  . [COMPLETED] iron dextran (INFED/DEXFERRUM) 1000 MG IVPB  1,000 mg Intravenous  Once  . [COMPLETED] iron dextran (INFED/DEXFERRUM) IVPB (TEST DOSE)  25 mg Intravenous Once  . multivitamin with minerals  1 tablet Oral BID  . pantoprazole  40 mg Oral BID AC  . thiamine  50 mg Oral Daily  . vitamin B-12  1,000 mcg Oral Custom  . Vitamin D (Ergocalciferol)  50,000 Units Oral Custom  . [DISCONTINUED] docusate sodium  100 mg Oral BID   Continuous Infusions:   Active Problems:  Iron deficiency anemia  Refusal of blood transfusions as patient is Jehovah's Witness  S/P gastric bypass  Depression  Polysubstance abuse    Time spent: 35 minutes    [facility] A  [facility] Pager [phone]. If 8PM-8AM, please contact night-coverage at [email or web address], password [place] 01/26/2012, 10:39 AM  LOS: 3 days

## 2012-01-26 NOTE — ED Notes (Signed)
Patient in restroom.

## 2012-01-26 NOTE — Progress Notes (Signed)
[id number] 01/26/2012 9:45 AM  Patient was reviewed with Assessment and chart evaluated. I spoke with Dr. Rubin Payor at [facility] and accepted the patient to [facility] when a bed is available.  IM will continue to consult and follow this patient.  She is compensated and not symptomatic from her anemia, she has been treated with IV Iron, and is given IV fluids.  She will transfer when a bed is available. Rona Ravens. Ren Grasse PAC

## 2012-01-26 NOTE — ED Notes (Signed)
Pt states that her anxiety level has decreased.

## 2012-01-26 NOTE — ED Notes (Signed)
+   urine Chart sent to EDP office for review. 
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
## 2012-01-26 NOTE — ED Notes (Signed)
MD at bedside. 

## 2012-01-26 NOTE — BH Assessment (Signed)
Assessment Note  Update:  Received call from [facility] stating pt accepted by Shelda Jakes, PA to Dr. Dub Mikes to bed 300-1 and that pt could be transported to [facility].  Updated EDP [place] and ED staff.  Updated assessment disposition, completed assessment notification and support paperwork, faxed to [facility] to log.  ED staff to arrange transport to [facility] via security, as pt is voluntary.     Disposition:  Disposition Disposition of Patient: Inpatient treatment program Type of inpatient treatment program: Adult Patient referred to: Other (Comment) (Pt accepted [facility])  On Site Evaluation by:   Reviewed with Physician:  Thornton Papas, Rennis Harding 01/26/2012 4:44 PM

## 2012-01-27 ENCOUNTER — Encounter (HOSPITAL_COMMUNITY): Payer: Self-pay | Admitting: Psychiatry

## 2012-01-27 DIAGNOSIS — F3289 Other specified depressive episodes: Principal | ICD-10-CM

## 2012-01-27 DIAGNOSIS — F329 Major depressive disorder, single episode, unspecified: Principal | ICD-10-CM

## 2012-01-27 DIAGNOSIS — F1994 Other psychoactive substance use, unspecified with psychoactive substance-induced mood disorder: Secondary | ICD-10-CM

## 2012-01-27 DIAGNOSIS — F4310 Post-traumatic stress disorder, unspecified: Secondary | ICD-10-CM

## 2012-01-27 LAB — FOLATE: Folate: 9.2 ng/mL (ref >5.4)

## 2012-01-27 MED ORDER — VITAMIN B-12 1000 MCG PO TABS
1000.0000 ug | ORAL_TABLET | Freq: Every day | ORAL | Status: DC
  Administered 2012-01-27 – 2012-01-31 (×4): 1000 ug via ORAL
  Filled 2012-01-27 (×7): qty 1

## 2012-01-27 MED ORDER — POLYETHYLENE GLYCOL 3350 17 G PO PACK
17.0000 g | PACK | Freq: Every day | ORAL | Status: DC | PRN
  Administered 2012-01-27: 17 g via ORAL
  Filled 2012-01-27: qty 1

## 2012-01-27 MED ORDER — ARIPIPRAZOLE 2 MG PO TABS
2.0000 mg | ORAL_TABLET | Freq: Every day | ORAL | Status: DC
  Administered 2012-01-27 – 2012-01-29 (×3): 2 mg via ORAL
  Filled 2012-01-27 (×6): qty 1

## 2012-01-27 MED ORDER — VITAMIN B-1 100 MG PO TABS
100.0000 mg | ORAL_TABLET | Freq: Every day | ORAL | Status: DC
  Administered 2012-01-27 – 2012-01-31 (×5): 100 mg via ORAL
  Filled 2012-01-27 (×7): qty 1

## 2012-01-27 MED ORDER — URSODIOL 300 MG PO CAPS
300.0000 mg | ORAL_CAPSULE | Freq: Three times a day (TID) | ORAL | Status: DC
  Administered 2012-01-27 – 2012-01-31 (×12): 300 mg via ORAL
  Filled 2012-01-27 (×19): qty 1

## 2012-01-27 MED ORDER — VITAMIN D (ERGOCALCIFEROL) 1.25 MG (50000 UNIT) PO CAPS
50000.0000 [IU] | ORAL_CAPSULE | ORAL | Status: DC
  Administered 2012-01-27: 50000 [IU] via ORAL
  Filled 2012-01-27: qty 1

## 2012-01-27 MED ORDER — CALCIUM CARBONATE 1250 (500 CA) MG PO TABS
1.0000 | ORAL_TABLET | Freq: Every day | ORAL | Status: DC
  Administered 2012-01-27 – 2012-01-31 (×5): 500 mg via ORAL
  Filled 2012-01-27 (×7): qty 1
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
## 2012-01-27 NOTE — Progress Notes (Signed)
Met with pt 1:1 who is irritable and displeased with being here. She reports, "there's no interaction for my higher learning." "I'm not chemically dependent." "This (being here) just makes me want to hurt someone." Pt blunted in affect. Medicated prior to the start of this shift for anxious, agitation and pain. Decrease on reassess. Spoke with pt about importance of safety on unit. Also encouraged pt to attend groups tomorrow before deciding this admit will not be helpful for her. She is able to contract for safety but does bear close monitoring. Denies SI/AVH. Lawrence Marse[place]
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
## 2012-01-27 NOTE — Progress Notes (Signed)
INITIAL ADULT NUTRITION ASSESSMENT Date: 01/27/2012   Time: 4:40 PM Reason for Assessment: consult  ASSESSMENT: Female 41 y.o.  Dx:  PTSD, cocaine abuse, MDD  Hx:  Past Medical History  Diagnosis Date  . Anxiety 2004    PTSD  . Depression 2004  . Bipolar 2 disorder   . Anemia     (refuses blood-Jehova Witness)   Past Surgical History  Procedure Date  . Appendectomy   . C sections x 2   . Gastric bypass July 05, 2010  . Hernia repair   . Tonsillectomy 1974  . Dilation and curettage of uterus 2008     Related Meds:  Scheduled Meds:   . ARIPiprazole  2 mg Oral Daily  . calcium carbonate  1 tablet Oral Daily  . multivitamins with iron  1 tablet Oral BID  . pantoprazole  40 mg Oral BID AC  . thiamine  100 mg Oral Daily  . ursodiol  300 mg Oral TID  . vitamin B-12  1,000 mcg Oral Daily  . Vitamin D (Ergocalciferol)  50,000 Units Oral Q7 days  . [DISCONTINUED] calcium citrate  200 mg of elemental calcium Oral BID  . [DISCONTINUED] thiamine  50 mg Oral Daily  . [DISCONTINUED] ursodiol  500 mg Oral BID  . [DISCONTINUED] vitamin B-12  1,000 mcg Oral 2 times weekly  . [DISCONTINUED] Vitamin D (Ergocalciferol)  50,000 Units Oral 2 times weekly   Continuous Infusions:  PRN Meds:.acetaminophen, alum & mag hydroxide-simeth, busPIRone, LORazepam, magnesium hydroxide, oxyCODONE, polyethylene glycol, temazepam   Ht: 5' 9.5" (176.5 cm)  Wt: 130 lb (58.968 kg)  Ideal Wt: 150 lbs % Ideal Wt: 86%  Usual Wt: 290 lbs per pt report % Usual Wt: 44%  Body mass index is 18.92 kg/(m^2).  Food/Nutrition Related Hx: gastric bypass in April 2012, 167 lbs wt loss since surgery  Labs:  CMP     Component Value Date/Time   NA 138 01/23/2012 1815   K 3.7 01/23/2012 1815   CL 102 01/23/2012 1815   CO2 27 01/23/2012 1815   GLUCOSE 105* 01/23/2012 1815   BUN 8 01/23/2012 1815   CREATININE 0.62 01/23/2012 1815   CALCIUM 8.9 01/23/2012 1815   PROT 6.8 11/17/2010 1225   ALBUMIN  3.4* 11/17/2010 1225   AST 14 11/17/2010 1225   ALT 10 11/17/2010 1225   ALKPHOS 99 11/17/2010 1225   BILITOT 0.3 11/17/2010 1225   GFRNONAA >90 01/23/2012 1815   GFRAA >90 01/23/2012 1815    Intake: 50% Output:  No intake or output data in the 24 hours ending 01/27/12 1642   Diet Order: General  Supplements/Tube Feeding: none at this time  IVF:    Estimated Nutritional Needs:   Kcal: 1500-1800 kcal Protein: 68-80g Fluid: >1.8 L/day  Pt is s/p gastric bypass in April of 2012.  Pt has lost 167 lbs in 1 yr, but has been able to gain 12 lbs back recently.  She requires extra time to consume meals but has thus far declined to eat in her room where she could have more time to eat. Pt requires high protein snacks but is lactose intolerant and therefore has been unable to consume adequate snacks between meals. RD discussed nourishment options with staff and pt will be able to bring a to-go container or deli roll-up to the unit to consume at snack time- RN may request pt consume these in her room. Pt states she sometimes drinks Ensure but requires miralax at the same  time as ensure to prevent constipation.   NUTRITION DIAGNOSIS: -Inadequate oral intake (NI-2.1).  Status: Ongoing  RELATED TO: slow meal pace, altered GI tract  AS EVIDENCE BY: pt s/p gastric bypass, requiring increased time for meal completion, frequent snacks  MONITORING/EVALUATION(Goals): 1.  Food/Beverage; improvement in intake with addition of high protein snacks 2.  Wt/wt change; deter further loss  EDUCATION NEEDS: -Education needs addressed  INTERVENTION: 1.  Meals/snacks; pt has been giving option of eating in her room which she has thus far declined.  RD discussed snack needs with RN and Metallurgist.  Pt will have option of keeping to-go container of meal or deli roll-up that she can have at snack time.  RN to discuss with tech who will bring food back for pt.  2.  Supplements; pt states she sometimes drinks Ensure  but requires that miralax be mix IN THE SUPPLEMENT AT THE TIME SHE CONSUMES IT to prevent constipation.  RD defers to MD for appropriateness to order.   DOCUMENTATION CODES Per approved criteria  -Underweight    Loyce Dys Sue-Ellen 01/27/2012, 4:40 PM

## 2012-01-27 NOTE — Progress Notes (Signed)
[facility] Group Notes:  (Counselor/Nursing/MHT/Case Management/Adjunct)  01/27/2012 11:15 AM  Type of Therapy:  Psychoeducational Skills  Participation Level:  None  Participation Quality:  Inattentive and Resistant  Affect:  Flat  Cognitive:  Alert  Engagement in Group:  None  Engagement in Therapy:  None  Modes of Intervention:  Activity, Socialization and Support  Summary of Progress/Problems: Pt did not participate with the rest of the group in playing a game called "Buzzword" for therapeutic activity. Pt was talking with another pt during group and was inattentive.     Sakina, Orfanos 01/27/2012, 11:15 AM

## 2012-01-27 NOTE — Tx Team (Signed)
Interdisciplinary Treatment Plan Update (Adult)  Date:  01/27/2012  Time Reviewed:  11:00 AM   Progress in Treatment: Attending groups: Yes Participating in groups:  Yes Taking medication as prescribed: Yes Tolerating medication:  Yes Family/Significant othe contact made:  No, pt refused Patient understands diagnosis:  Yes Discussing patient identified problems/goals with staff:  Yes Medical problems stabilized or resolved:  Yes Denies suicidal/homicidal ideation: Yes Issues/concerns per patient self-inventory:  None identified Other: N/A  New problem(s) identified: None Identified  Reason for Continuation of Hospitalization: Anxiety Depression Homicidal ideation Medication stabilization  Interventions implemented related to continuation of hospitalization: mood stabilization, medication monitoring and adjustment, group therapy and psycho education, suicide risk assessment, collateral contact, aftercare planning, ongoing physician assessments and safety checks q 15 mins  Additional comments: N/A  Estimated length of stay: 3-5 days  Discharge Plan: CSW is assessing for appropriate referrals.    New goal(s): N/A  Review of initial/current patient goals per problem list:    1.  Goal(s): Address substance use by completing detox protocol  Met:  No  Target date: 4 days  As evidenced by: addressed by going to groups.    2.  Goal (s): Reduce depressive symptoms from a 10 to a 3  Met:  No  Target date: 3-5 days  As evidenced by: Pt rates as high today  3.  Goal (s): Reduce anxiety symptoms from a 10 to a 3  Met:  No  Target date:  3-5 days  As evidenced by: Pt rates as high today  4.  Goal(s): Eliminate HI  Met:  No  Target date: 3-5 days  As evidenced by: pt denying HI towards husband.     Attendees: Patient:     Family:     Physician: Geoffery Lyons, MD 01/27/2012 11:00 AM   Nursing: Sharyn Blitz, RN 01/27/2012 11:00 AM   Clinical Social Worker:   Reyes Ivan, LCSWA 01/27/2012  11:00 AM   Other:  01/27/2012  11:00 AM   Other:     Other:     Other:     Other:      Scribe for Treatment Team:   Reyes Ivan 01/27/2012 11:00 AM

## 2012-01-27 NOTE — Progress Notes (Signed)
  D) Patient irritable upon my assessment. Patient states slept " terrible," and  appetite is "improving." Patient rates depression as   7/10, patient rates hopeless feelings as  4/10. Patient denies SI/HI, denies A/V hallucinations.   A) Patient offered support and encouragement, patient encouraged to discuss feelings/concerns with staff. Patient verbalized understanding. Patient monitored Q15 minutes for safety. Patient met with MD to discuss today's goals and plan of care.  R) Patient isolates to room at times. Patient verbalizes "I don't have enough time to eat in the dining room." MD ordered patient be allowed to eat in her room. Patient continues to attend meals in dining room, patient verbalizes understanding of MD order.  Patient taking medications as ordered. Will continue to monitor.

## 2012-01-27 NOTE — Progress Notes (Signed)
D:  Pt c/o constipation, denies SI, admits to HI towards husband who lives in [place], pt states that "he left me at the time when i needed him the most in my life."  Pt reports "I am afraid to go to sleep because my mother and grandmother died in their sleep.  i am afraid that could happen to me because they didn't have anything wrong with them."  Pt c/o 10/10 headache pain, hx migraine syndrome.  A:  Milk of Magnesia and Miralax given per prn orders for constipation, prn pain medication given, see pain and prn flowsheets.  R:  Pt cooperative and attends groups, awaiting results from laxatives given.

## 2012-01-28 DIAGNOSIS — F332 Major depressive disorder, recurrent severe without psychotic features: Secondary | ICD-10-CM

## 2012-01-28 MED ORDER — BENZOCAINE 10 % MT GEL
Freq: Four times a day (QID) | OROMUCOSAL | Status: DC | PRN
  Administered 2012-01-28: 09:00:00 via OROMUCOSAL
  Filled 2012-01-28: qty 9.4

## 2012-01-28 MED ORDER — FERROUS SULFATE 325 (65 FE) MG PO TABS
325.0000 mg | ORAL_TABLET | Freq: Three times a day (TID) | ORAL | Status: DC
  Administered 2012-01-28 – 2012-01-31 (×9): 325 mg via ORAL
  Filled 2012-01-28 (×16): qty 1

## 2012-01-28 MED ORDER — MAGNESIUM CITRATE PO SOLN
1.0000 | Freq: Once | ORAL | Status: AC
  Administered 2012-01-28: 1 via ORAL

## 2012-01-28 MED ORDER — MAGNESIUM CITRATE PO SOLN
1.0000 | Freq: Once | ORAL | Status: AC | PRN
  Administered 2012-01-28: 1 via ORAL

## 2012-01-28 MED ORDER — FERROUS SULFATE 325 (65 FE) MG PO TABS: 325.0000 mg | ORAL_TABLET | Freq: Three times a day (TID) | ORAL | Status: DC

## 2012-01-28 NOTE — Progress Notes (Addendum)
Pt was complaining stating that she thought that her blood pressure was low. Pt's vitals taken. BP 112/77 HR 86.
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
## 2012-01-28 NOTE — Progress Notes (Signed)
Patient ID: Alicia Zamora, female   DOB: May 11, 1970, 41 y.o.   MRN: [id number]  D: Patient report that some tooth pain woke her up from sleeping. Recently had Oxy IR so unable to get that at this time but received tylenol and an order obtained for some orajel prn. Patient reports her mood is starting to get better but was slightly agitated when told that she was getting a roommate. Calmed down when she met new roommate and realized that they know each other. Patient having a hard time sleeping even with the restoril that was given.  A: Staff will monitor on q 15 minute checks and follow treatments and give medications as ordered R: Patient obtained Prn meds and staff encouraged her to try to sleep some more.
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
## 2012-01-28 NOTE — Progress Notes (Signed)
Goals Group Note  Date:  01/28/2012 Time: 0915  Group Topic/Focus:  Identifying Goals:   The focus of this group is to help patients identify their personal goals they want to achieve, it introduces them to the Saturday Patient Workbook and it helps motivate them to begin to take the steps they need to take ...in order to begin to achieve their goals. Participation Level:  passive Participation Quality: good Affect: flat Cognitive:  good  Insight:  good  Engagement in Group: engaged  Additional Comments:   PD RN BC [id number]

## 2012-01-28 NOTE — Progress Notes (Signed)
Psychoeducational Group Note  Date:  01/28/2012 Time: 1515  Group Topic/Focus:  Healthy Communication:   The focus of this group is to discuss communication, barriers to communication, as well as healthy ways to communicate with others.  Participation Level:  Active  Participation Quality:  Appropriate, Sharing and Supportive  Affect:  Appropriate  Cognitive:  Appropriate  Insight:  Good  Engagement in Group:  Good  Additional Comments:  none  Almena Hokenson M 01/28/2012, 2:47 PM

## 2012-01-28 NOTE — Progress Notes (Signed)
Psychoeducational Group Note  Date: 01/28/2012 Time:  1015  Group Topic/Focus:  Identifying Needs:   The focus of this group is to help patients identify their personal needs that have been historically problematic and identify healthy behaviors to address their needs.  Participation Level:  Active  Participation Quality:  Appropriate  Affect:  Appropriate  Cognitive:  Appropriate  Insight:  Good  Engagement in Group:  Good  Additional Comments:    Madeleyn Schwimmer A  
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
## 2012-01-28 NOTE — Progress Notes (Signed)
Psychoeducational Group Note  Date:  01/28/2012 Time:  2000  Group Topic/Focus:  Wrap-Up Group:   The focus of this group is to help patients review their daily goal of treatment and discuss progress on daily workbooks.  Participation Level:  Active  Participation Quality:  Appropriate  Affect:  Appropriate  Cognitive:  Appropriate  Insight:  Good  Engagement in Group:  Good  Additional Comments:    Tekelia Kareem A 01/28/2012, 4:29 AM

## 2012-01-29 MED ORDER — DIVALPROEX SODIUM ER 250 MG PO TB24
250.0000 mg | ORAL_TABLET | Freq: Two times a day (BID) | ORAL | Status: DC
  Administered 2012-01-29 – 2012-01-31 (×4): 250 mg via ORAL
  Filled 2012-01-29 (×9): qty 1

## 2012-01-29 MED ORDER — ARIPIPRAZOLE 10 MG PO TABS
10.0000 mg | ORAL_TABLET | Freq: Every day | ORAL | Status: DC
  Administered 2012-01-30 – 2012-01-31 (×2): 10 mg via ORAL
  Filled 2012-01-29 (×4): qty 1

## 2012-01-29 NOTE — Progress Notes (Signed)
Did not attend 1 pm Psycho educational group

## 2012-01-29 NOTE — Clinical Social Work Note (Signed)
[facility] Group Notes:  (Clinical Social Work)  01/29/2012   3:00-4:00PM  Summary of Progress/Problems:   The main focus of today's process group was for the patient to define "support" and describe what healthy supports are, then to identify the patient's current support system and decide on other supports that can be put in place to prevent future hospitalizations.  An emphasis was placed on using therapist, doctor and problem-specific support groups to expand supports. The patient expressed frustration at having another group.  She stayed in group long enough to say she has no supports, but as the discussion progressed, she left and did not return.  Type of Therapy:  Group Therapy  Participation Level:  Minimal  Participation Quality:  Inattentive and Resistant  Affect:  Blunted and Depressed  Cognitive:  Oriented  Insight:  Limited  Engagement in Group:  None  Engagement in Therapy:  None  Modes of Intervention:  Clarification, Education, Limit-setting, Problem-solving, Socialization, Support and Processing   Ambrose Mantle, LCSW 01/29/2012, 4:26 PM

## 2012-01-29 NOTE — Progress Notes (Signed)
Patient ID: In Shidler, female   DOB: Dec 17, 1970, 41 y.o.   MRN: [id number] [facility] MD Progress Note  01/29/2012 2:04 PM Alicia Zamora  MRN:  [id number]  S: "I have high anxiety today. I feel irritated, raged and angry. I am trying to stay in my room because I don't want to have to jump at people's throat. I don't know what it, but my medicines are not working, helping or something. I have migraine headaches, whatever I took, it did not help".  O: Negative for fever.  HENT: Negative for congestion and rhinorrhea.  Respiratory: Negative for cough, chest tightness and shortness of breath.  Cardiovascular: Negative for chest pain, is very anemic with low HGB of 7.2.  Gastrointestinal: Negative for nausea, vomiting and abdominal pain.  Skin: Negative for rash.  Neurological: Negative for weakness and headaches. Over all appearance, patient is tall and excessively slim framed.  Diagnosis:   Axis I: Major Depression, Recurrent severe and Post Traumatic Stress Disorder Axis II: Deferred Axis III:  Past Medical History  Diagnosis Date  . Anxiety 2004    PTSD  . Depression 2004  . Bipolar 2 disorder   . Anemia     (refuses blood-[employer] [employer])   Axis IV: other psychosocial or environmental problems and problems related to social environment Axis V: 41-50 serious symptoms  ADL's:  Intact  Sleep: Poor  Appetite:  Fair  Suicidal Ideation: "No" Plan:  No Intent:  No Means:  no Homicidal Ideation: "No" Plan:  No Intent:  No Means:  no  AEB (as evidenced by): Per patient's reports  Mental Status Examination/Evaluation: Objective:  Appearance: Casual, thin  Eye Contact::  Good  Speech:  Clear and Coherent  Volume:  Increased  Mood:  Anxious and Depressed  Affect:  Blunt and Flat, tearful  Thought Process:  Circumstantial and Coherent  Orientation:  Full  Thought Content:  Paranoid Ideation and Rumination  Suicidal Thoughts:  No  Homicidal Thoughts:  Yes.  without  intent/plan  Memory:  Immediate;   Good Recent;   Good Remote;   Good  Judgement:  Good  Insight:  Good  Psychomotor Activity:  Normal  Concentration:  Good  Recall:  Good  Akathisia:  No  Handed:  Right  AIMS (if indicated):     Assets:  Communication Skills Desire for Improvement  Sleep:  Number of Hours: 4    Vital Signs:Blood pressure 108/45, pulse 101, temperature 98.2 F (36.8 C), temperature source Oral, resp. rate 16, height 5' 9.5" (1.765 m), weight 58.968 kg (130 lb), last menstrual period 01/11/2012. Current Medications: Current Facility-Administered Medications  Medication Dose Route Frequency Provider Last Rate Last Dose  . acetaminophen (TYLENOL) tablet 650 mg  650 mg Oral Q6H PRN Cleotis Nipper, MD   650 mg at 01/28/12 0857  . alum & mag hydroxide-simeth (MAALOX/MYLANTA) 200-200-20 MG/5ML suspension 30 mL  30 mL Oral Q4H PRN Cleotis Nipper, MD      . ARIPiprazole (ABILIFY) tablet 2 mg  2 mg Oral Daily Rachael Fee, MD   2 mg at 01/29/12 1253  . benzocaine (ORAJEL) 10 % mucosal gel   Mouth/Throat QID PRN Nanine Means, NP      . busPIRone (BUSPAR) tablet 15 mg  15 mg Oral TID PRN Cleotis Nipper, MD   15 mg at 01/29/12 0009  . calcium carbonate (OS-CAL - dosed in mg of elemental calcium) tablet 500 mg of elemental calcium  1 tablet Oral Daily Reymundo Poll  Dub Mikes, MD   500 mg of elemental calcium at 01/29/12 1253  . ferrous sulfate tablet 325 mg  325 mg Oral TID WC Sanjuana Kava, NP   325 mg at 01/29/12 1253  . LORazepam (ATIVAN) tablet 1 mg  1 mg Oral QID PRN Cleotis Nipper, MD   1 mg at 01/29/12 0536  . magnesium hydroxide (MILK OF MAGNESIA) suspension 30 mL  30 mL Oral Daily PRN Cleotis Nipper, MD   30 mL at 01/27/12 2138  . multivitamins with iron tablet 1 tablet  1 tablet Oral BID Cleotis Nipper, MD   1 tablet at 01/29/12 1253  . oxyCODONE (Oxy IR/ROXICODONE) immediate release tablet 5 mg  5 mg Oral TID PRN Cleotis Nipper, MD   5 mg at 01/29/12 0536  . pantoprazole (PROTONIX) EC  tablet 40 mg  40 mg Oral BID AC Cleotis Nipper, MD   40 mg at 01/29/12 0634  . polyethylene glycol (MIRALAX / GLYCOLAX) packet 17 g  17 g Oral Daily PRN Rachael Fee, MD   17 g at 01/27/12 2216  . temazepam (RESTORIL) capsule 7.5 mg  7.5 mg Oral QHS PRN Cleotis Nipper, MD   7.5 mg at 01/28/12 2254  . thiamine (VITAMIN B-1) tablet 100 mg  100 mg Oral Daily Rachael Fee, MD   100 mg at 01/29/12 1253  . ursodiol (ACTIGALL) capsule 300 mg  300 mg Oral TID Rachael Fee, MD   300 mg at 01/29/12 1254  . vitamin B-12 (CYANOCOBALAMIN) tablet 1,000 mcg  1,000 mcg Oral Daily Rachael Fee, MD   1,000 mcg at 01/28/12 1249  . Vitamin D (Ergocalciferol) (DRISDOL) capsule 50,000 Units  50,000 Units Oral Q7 days Rachael Fee, MD   50,000 Units at 01/27/12 1006  . [DISCONTINUED] ferrous sulfate tablet 325 mg  325 mg Oral TID WC Sanjuana Kava, NP        Lab Results: No results found for this or any previous visit (from the past 48 hour(s)).  Physical Findings: AIMS: Facial and Oral Movements Muscles of Facial Expression: None, normal Lips and Perioral Area: None, normal Jaw: None, normal Tongue: None, normal,Extremity Movements Upper (arms, wrists, hands, fingers): None, normal Lower (legs, knees, ankles, toes): None, normal, Trunk Movements Neck, shoulders, hips: None, normal, Overall Severity Severity of abnormal movements (highest score from questions above): None, normal Incapacitation due to abnormal movements: None, normal Patient's awareness of abnormal movements (rate only patient's report): No Awareness, Dental Status Current problems with teeth and/or dentures?: No Does patient usually wear dentures?: No  CIWA:  CIWA-Ar Total: 0  COWS:  COWS Total Score: 1   Treatment Plan Summary: Daily contact with patient to assess and evaluate symptoms and progress in treatment Medication management  Plan: Increase Abilify from 2 mg to 10 mg daily for mood control. Add Depakote ER 250 mg bid for mood  stabilization. Obtain Depakote levels in 3 days. Continue current treatment plan.   Armandina Stammer I 01/29/2012, 2:04 PM

## 2012-01-29 NOTE — Progress Notes (Signed)
Group Topic/Focus:  Making Healthy Choices:   The focus of this group is to help patients identify negative/unhealthy choices they were using prior to admission and identify positive/healthier coping strategies to replace them upon discharge.  Participation Level:  Active  Participation Quality:  Appropriate  Affect:  Appropriate  Cognitive:  Appropriate  Insight:  Good  Engagement in Group:  Good  Additional Comments:    Roberta Angell A 01/29/2012   

## 2012-01-29 NOTE — Progress Notes (Signed)
Alicia Zamora has been uncomfortable most of the day. She has spent most of the day in her bed...dozing but she integrates well when she chooses to come out of her room and hang around the other patients.    A She completed her AM self inventory and on it she wrote she denied SI within the past 24 hrs

## 2012-01-30 DIAGNOSIS — F1910 Other psychoactive substance abuse, uncomplicated: Secondary | ICD-10-CM

## 2012-01-30 LAB — URINALYSIS, ROUTINE W REFLEX MICROSCOPIC
Glucose, UA: NEGATIVE mg/dL
Hgb urine dipstick: NEGATIVE
Specific Gravity, Urine: 1.017 (ref 1.005–1.030)
Urobilinogen, UA: 0.2 mg/dL (ref 0.0–1.0)

## 2012-01-30 LAB — URINE MICROSCOPIC-ADD ON

## 2012-01-30 MED ORDER — SULFAMETHOXAZOLE-TMP DS 800-160 MG PO TABS
1.0000 | ORAL_TABLET | Freq: Once | ORAL | Status: AC
  Administered 2012-01-30: 1 via ORAL
  Filled 2012-01-30: qty 1

## 2012-01-30 MED ORDER — SULFAMETHOXAZOLE-TMP DS 800-160 MG PO TABS
1.0000 | ORAL_TABLET | Freq: Two times a day (BID) | ORAL | Status: DC
  Administered 2012-01-30 – 2012-01-31 (×2): 1 via ORAL
  Filled 2012-01-30 (×6): qty 1

## 2012-01-30 NOTE — Progress Notes (Signed)
D: Patient denies SI/HI and A/V hallucinations; patient reports that she is fine and that her medications worked for her in the past and they just need to be increased  A: Monitored q 15 minutes; patient encouraged to attend groups; patient educated about medications; patient given medications per physician orders; patient encouraged to express feelings and/or concerns; prn medications  R: Patient is cooperative and very assertive and was irritable at beginning of shift but has calmed down; patient's interaction with staff and peers is appropriate and intrusive at times;  patient is taking medications as prescribed and tolerating medications; patient attended this evening group

## 2012-01-30 NOTE — Progress Notes (Signed)
Nutrition Brief Note:  RD met with pt last week to discuss nutrition needs and collaborated with staff on how to assist pt in meeting specialized diet needs.  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  Pt takes MVI daily to support vitamin/mineral needs.    RD notes additional exceptions have been made for pt in order to ensure she is getting snacks per her preference and as needed including tuna salad which is being stored for pt.  Pt continues to go to the cafeteria for meals which is appropriate and per her choice and staff is working with her slower pace by offering extended time and additional snacks.    RD discussed with RN.  No additional needs or interventions warranted at this time.   Please re-consult RD if needed.  Loyce Dys, MS RD LDN Clinical Inpatient Dietitian Pager: [phone] Weekend/After hours pager: [phone]

## 2012-01-30 NOTE — Clinical Social Work Note (Signed)
Interdisciplinary Treatment Plan Update (Adult)       Discharge Planning Group 8:30-9:30 AM 01/30/2012  Patient seen during d/c planning group.  She advised of admitting to the hospital with HI.  She current denies SI/Hi .  She rates anxiety and helplessness at six.   Patient reports having home, transportation and access to medications.  She will need assistance with outpatient follow up.      [facility] Group Notes:  (Counselor/Nursing/MHT/Case Management/Adjunct)        Overcoming Obstacles 1:15 -2:30 PM   01/30/2012   1:15   Type of Therapy: Group  Participation Level:  Active  Participation Quality:  Attentive  Affect:  Appropriate  Cognitive:  Alert and Appropriate  Insight:  Good  Engagement in Group:  Good  Engagement in Therapy:  Good  Modes of Intervention:  Education, Problem-solving, Support and Exploration  Summary of Progress/Problems: Alicia Zamora was very tearful and talked about the obstacle of several family members death and separation from her husband of 18 years in August of this year.  She shared is has left her feeling angry, insufficient and less than.  Patient encouraged not to give her power away by believing she can only be happy with husband in the home.   Wynn Banker 01/30/2012 12:57 PM

## 2012-01-30 NOTE — Progress Notes (Signed)
Patient has been up in the dayroom interacting with peers. Patient has been in a happy mood reporting to writer that she has been singing her way through it today. Patient continues to complain of headaches and was informed of medication added. Patient has been compliant with meds, denies si/a/v hallucinations. Patient continues to endorse hi toward her husband. Safety maintained on unit, will continue to monitor with 15 min checks.

## 2012-01-30 NOTE — Tx Team (Signed)
Interdisciplinary Treatment Plan Update (Adult)  Date:  01/30/2012  Time Reviewed:  12:22 PM   Progress in Treatment: Attending groups:   Yes   Participating in groups:  Yes Taking medication as prescribed:  Yes Tolerating medication:  Yes Family/Significant othe contact made: Contact made with family Patient understands diagnosis:  Yes Discussing patient identified problems/goals with staff: Yes Medical problems stabilized or resolved: Yes Denies suicidal/homicidal ideation:Yes Issues/concerns per patient self-inventory:  Other:   New problem(s) identified:  Reason for Continuation of Hospitalization: Anxiety Depression Medication stabilization   Interventions implemented related to continuation of hospitalization:  Medication Management; safety checks q 15 mins  Additional comments:  Estimated length of stay:  2-3 days  Discharge Plan:  Home with outpatient follow up  New goal(s):  Review of initial/current patient goals per problem list:    1.  Goal(s): Eliminate SI/HIother thoughts of self harm   Met:  Yes  Target date: d/c  As evidenced by: Patient no longer endorsing SI/HI or other thoughts of self harm.    2.  Goal (s):Reduce depression/anxiety (Patient rates anxiety and helplessness at six)  Met: Yes  Target date: d/c  As evidenced by: Patient currently rating symptoms at four or below    3.  Goal(s):.stabilize on meds   Met:  No  Target date: d/c  As evidenced by: Patient reports being stabilized on medications - less symptomatic    4.  Goal(s): Refer for outpatient follow up   Met:  No  Target date: d/c  As evidenced by: Follow up appointment scheduled    Attendees: Patient:   01/30/2012 12:22 PM  Physican:  Patrick North, MD 01/30/2012 12:22 PM  Nursing:  Neill Loft, RN 01/30/2012 12:22 PM   Nursing:   Quintella Reichert, RN 01/30/2012 12:22 PM   Clinical Social Worker:  Juline Patch, LCSW 01/30/2012 12:22 PM   Other:  Serena Colonel, FNP 01/30/2012 12:22 PM   Other:        01/30/2012 01/30/2012 Other:        01/30/2012 12:22 PM

## 2012-01-30 NOTE — Progress Notes (Signed)
[facility] MD Progress Note  01/30/2012 2:35 PM Alicia Zamora  MRN:  [id number]  Diagnosis:   Axis I: Depressive Disorder NOS, Post Traumatic Stress Disorder and Substance Abuse Axis II: Deferred Axis III:  Past Medical History  Diagnosis Date  . Anxiety 2004    PTSD  . Depression 2004  . Bipolar 2 disorder   . Anemia     (refuses blood-Jehova Witness)   Axis IV: economic problems, other psychosocial or environmental problems, problems related to social environment and problems with primary support group Axis V: 41-50 serious symptoms  ADL's:  Intact  Sleep: Poor  Appetite:  Fair  Suicidal Ideation:  Denies Homicidal Ideation:  Denies  Mental Status Examination/Evaluation: Objective:  Appearance: Casual  Eye Contact::  Fair  Speech:  Normal Rate  Volume:  Normal  Mood:  Depressed  Affect:  Depressed  Thought Process:  Coherent  Orientation:  Full  Thought Content:  WDL  Suicidal Thoughts:  No  Homicidal Thoughts:  No  Memory:  Immediate;   Fair Recent;   Fair Remote;   Fair  Judgement:  Fair  Insight:  Lacking  Psychomotor Activity:  Normal  Concentration:  Fair  Recall:  Fair  Akathisia:  No  Handed:  Right  AIMS (if indicated):     Assets:  Communication Skills Desire for Improvement Social Support  Sleep:  Number of Hours: 2.5    Vital Signs:Blood pressure 96/61, pulse 108, temperature 98.5 F (36.9 C), temperature source Oral, resp. rate 14, height 5' 9.5" (1.765 m), weight 58.968 kg (130 lb), last menstrual period 01/11/2012. Current Medications: Current Facility-Administered Medications  Medication Dose Route Frequency Provider Last Rate Last Dose  . acetaminophen (TYLENOL) tablet 650 mg  650 mg Oral Q6H PRN Cleotis Nipper, MD   650 mg at 01/28/12 0857  . alum & mag hydroxide-simeth (MAALOX/MYLANTA) 200-200-20 MG/5ML suspension 30 mL  30 mL Oral Q4H PRN Cleotis Nipper, MD      . ARIPiprazole (ABILIFY) tablet 10 mg  10 mg Oral Daily Sanjuana Kava, NP    10 mg at 01/30/12 0819  . benzocaine (ORAJEL) 10 % mucosal gel   Mouth/Throat QID PRN Nanine Means, NP      . busPIRone (BUSPAR) tablet 15 mg  15 mg Oral TID PRN Cleotis Nipper, MD   15 mg at 01/30/12 0827  . calcium carbonate (OS-CAL - dosed in mg of elemental calcium) tablet 500 mg of elemental calcium  1 tablet Oral Daily Rachael Fee, MD   500 mg of elemental calcium at 01/30/12 0819  . divalproex (DEPAKOTE ER) 24 hr tablet 250 mg  250 mg Oral BID Sanjuana Kava, NP   250 mg at 01/30/12 0819  . ferrous sulfate tablet 325 mg  325 mg Oral TID WC Sanjuana Kava, NP   325 mg at 01/30/12 1204  . LORazepam (ATIVAN) tablet 1 mg  1 mg Oral QID PRN Cleotis Nipper, MD   1 mg at 01/30/12 0630  . magnesium hydroxide (MILK OF MAGNESIA) suspension 30 mL  30 mL Oral Daily PRN Cleotis Nipper, MD   30 mL at 01/27/12 2138  . multivitamins with iron tablet 1 tablet  1 tablet Oral BID Cleotis Nipper, MD   1 tablet at 01/30/12 1203  . oxyCODONE (Oxy IR/ROXICODONE) immediate release tablet 5 mg  5 mg Oral TID PRN Cleotis Nipper, MD   5 mg at 01/30/12 0627  . pantoprazole (PROTONIX) EC  tablet 40 mg  40 mg Oral BID AC Cleotis Nipper, MD   40 mg at 01/30/12 4540  . polyethylene glycol (MIRALAX / GLYCOLAX) packet 17 g  17 g Oral Daily PRN Rachael Fee, MD   17 g at 01/27/12 2216  . sulfamethoxazole-trimethoprim (BACTRIM DS) 800-160 MG per tablet 1 tablet  1 tablet Oral Q12H Nanine Means, NP      . [COMPLETED] sulfamethoxazole-trimethoprim (BACTRIM DS) 800-160 MG per tablet 1 tablet  1 tablet Oral Once Rachael Fee, MD   1 tablet at 01/30/12 1204  . temazepam (RESTORIL) capsule 7.5 mg  7.5 mg Oral QHS PRN Cleotis Nipper, MD   7.5 mg at 01/29/12 2227  . thiamine (VITAMIN B-1) tablet 100 mg  100 mg Oral Daily Rachael Fee, MD   100 mg at 01/30/12 0819  . ursodiol (ACTIGALL) capsule 300 mg  300 mg Oral TID Rachael Fee, MD   300 mg at 01/30/12 1204  . vitamin B-12 (CYANOCOBALAMIN) tablet 1,000 mcg  1,000 mcg Oral Daily Rachael Fee, MD   1,000 mcg at 01/30/12 [phone]  . Vitamin D (Ergocalciferol) (DRISDOL) capsule 50,000 Units  50,000 Units Oral Q7 days Rachael Fee, MD   50,000 Units at 01/27/12 1006    Lab Results: No results found for this or any previous visit (from the past 48 hour(s)).  Physical Findings: AIMS: Facial and Oral Movements Muscles of Facial Expression: None, normal Lips and Perioral Area: None, normal Jaw: None, normal Tongue: None, normal,Extremity Movements Upper (arms, wrists, hands, fingers): None, normal Lower (legs, knees, ankles, toes): None, normal, Trunk Movements Neck, shoulders, hips: None, normal, Overall Severity Severity of abnormal movements (highest score from questions above): None, normal Incapacitation due to abnormal movements: None, normal Patient's awareness of abnormal movements (rate only patient's report): No Awareness, Dental Status Current problems with teeth and/or dentures?: No Does patient usually wear dentures?: No  CIWA:  CIWA-Ar Total: 0  COWS:  COWS Total Score: 1   Treatment Plan Summary: Daily contact with patient to assess and evaluate symptoms and progress in treatment Medication management  Plan:  New clean catch urine ordered, based on findings from lab values--Bactrim ordered for UTI (patient stated this has worked for her in the past), nutritional consult ordered and called, dietary/cafeteria will order soy milk for her (requested) and fix her tuna fish for her to have to snack on (per request), gastric by-pass surgery (eats high protein, small amounts), close monitoring continues  Saloma Cadena, Catha Nottingham, NP 01/30/2012, 2:35 PM

## 2012-01-30 NOTE — Progress Notes (Signed)
Patient ID: Alicia Zamora, female   DOB: 1971/01/28, 41 y.o.   MRN: [id number] D Patient's mood is labile, ranging from happy to irritable and at times she is intrusive.  She is concerned about her medications being  compatible with her by pass surgery.  A-She talked with NP Asher Muir about her nutritional needs and  Plans are made to have tuna available for her on the unit. R- She was able to collect a clean catch urine as previous one may have been contaminated.  Gave patient a blue notebook to write down her concerns.  She was upset that multiple snacks were removed from her room during environmental rounds  Allowed patient to keep a few snacks and put the rest in the med room to be dispensed as needed.  Patient was okay with this plan.

## 2012-01-30 NOTE — Progress Notes (Signed)
Psychoeducational Group Note  Date:  01/30/2012 Time:  1100  Group Topic/Focus:  Self Care:   The focus of this group is to help patients understand the importance of self-care in order to improve or restore emotional, physical, spiritual, interpersonal, and financial health.  Participation Level:  None  Participation Quality:  Inattentive  Affect:  Appropriate  Cognitive:  Appropriate  Insight:  None  Engagement in Group:  None  Additional Comments:  Pt. Walked in and out of group several times. Did not participated in activity or speak.   Ruta Hinds [facility] 01/30/2012, 12:19 PM

## 2012-01-31 MED ORDER — PANTOPRAZOLE SODIUM 40 MG PO TBEC: 40.0000 mg | DELAYED_RELEASE_TABLET | Freq: Two times a day (BID) | ORAL | Status: AC

## 2012-01-31 MED ORDER — ESOMEPRAZOLE MAGNESIUM 40 MG PO CPDR: 40.0000 mg | DELAYED_RELEASE_CAPSULE | Freq: Two times a day (BID) | ORAL | Status: DC

## 2012-01-31 MED ORDER — SULFAMETHOXAZOLE-TMP DS 800-160 MG PO TABS: 1.0000 | ORAL_TABLET | Freq: Two times a day (BID) | ORAL | Status: AC

## 2012-01-31 MED ORDER — URSODIOL 500 MG PO TABS: 500.0000 mg | ORAL_TABLET | Freq: Two times a day (BID) | ORAL | Status: DC

## 2012-01-31 MED ORDER — CYANOCOBALAMIN 1000 MCG PO TABS: 1000.0000 ug | ORAL_TABLET | Freq: Every day | ORAL | Status: AC

## 2012-01-31 MED ORDER — VITAMIN B-12 1000 MCG/15ML PO LIQD: 15.0000 mL | ORAL | Status: DC

## 2012-01-31 MED ORDER — VITAMIN D (ERGOCALCIFEROL) 1.25 MG (50000 UNIT) PO CAPS: 50000.0000 [IU] | ORAL_CAPSULE | ORAL | Status: AC

## 2012-01-31 MED ORDER — FERROUS SULFATE 325 (65 FE) MG PO TABS: 325.0000 mg | ORAL_TABLET | Freq: Three times a day (TID) | ORAL | Status: AC

## 2012-01-31 MED ORDER — ONE-DAILY MULTI VITAMINS PO TABS: 1.0000 | ORAL_TABLET | Freq: Two times a day (BID) | ORAL | Status: AC

## 2012-01-31 MED ORDER — VITAMIN B-1 50 MG PO TABS
50.0000 mg | ORAL_TABLET | Freq: Every day | ORAL | Status: AC
Start: 2012-01-31 — End: ?

## 2012-01-31 MED ORDER — DIVALPROEX SODIUM ER 250 MG PO TB24: 250.0000 mg | ORAL_TABLET | Freq: Two times a day (BID) | ORAL | Status: DC

## 2012-01-31 MED ORDER — CALCIUM CITRATE 950 (200 CA) MG PO TABS: 1.0000 | ORAL_TABLET | Freq: Two times a day (BID) | ORAL | Status: AC

## 2012-01-31 MED ORDER — VITAMIN K 100 MCG PO TABS: 100.0000 ug | ORAL_TABLET | Freq: Every day | ORAL | Status: AC

## 2012-01-31 MED ORDER — ARIPIPRAZOLE 10 MG PO TABS: 10.0000 mg | ORAL_TABLET | Freq: Every day | ORAL | Status: DC

## 2012-01-31 MED ORDER — CYANOCOBALAMIN 1000 MCG PO TABS: 1000.0000 ug | ORAL_TABLET | Freq: Every day | ORAL | Status: DC

## 2012-01-31 NOTE — Progress Notes (Signed)
Psychoeducational Group Note  Date:  01/31/2012 Time:  2000  Group Topic/Focus:  Wrap-Up Group:   The focus of this group is to help patients review their daily goal of treatment and discuss progress on daily workbooks.  Participation Level:  Active  Participation Quality:  Intrusive, Inattentive, Monopolizing and Sharing  Affect:  Labile  Cognitive:  Oriented  Insight:  Limited  Engagement in Group:  Good  Additional Comments:  Patient shared that she used to be angry at her husband and wants to focus on that relationship and generating positive energy.  Saliha Salts, Newton Pigg 01/31/2012, 1:13 AM

## 2012-01-31 NOTE — Progress Notes (Signed)
[facility] Family/Significant Other Suicide Prevention Education  Suicide Prevention Education:  Education Completed;Saprena Selena Batten) Cindra Presume [phone] has been identified by the patient as the family member/significant other with whom the patient will be residing, and identified as the person(s) who will aid the patient in the event of a mental health crisis (suicidal ideations/suicide attempt).  With written consent from the patient, the family member/significant other has been provided the following suicide prevention education, prior to the and/or following the discharge of the patient.  The suicide prevention education provided includes the following:  Suicide risk factors  Suicide prevention and interventions  National Suicide Hotline telephone number  [facility] assessment telephone number  [facility] Emergency Assistance 911  [facility] and/or Residential Mobile Crisis Unit telephone number  Request made of family/significant other to:  Remove weapons (e.g., guns, rifles, knives), all items previously/currently identified as safety concern. Alicia Zamora reports to her knowledge there are guns in the home  Remove drugs/medications (over-the-counter, prescriptions, illicit drugs), all items previously/currently identified as a safety concern.  The family member/significant other verbalizes understanding of the suicide prevention education information provided.  The family member/significant other agrees to remove the items of safety concern listed above.  Alicia Zamora 01/31/2012, 1:10 PM
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
## 2012-01-31 NOTE — Progress Notes (Signed)
Psychoeducational Group Note  Date:  01/31/2012 Time:  1100  Group Topic/Focus:  Recovery Goals:   The focus of this group is to identify appropriate goals for recovery and establish a plan to achieve them.  Participation Level: Did Not Attend  Participation Quality:  Not Applicable  Affect:  Not Applicable  Cognitive:  Not Applicable  Insight:  Not Applicable  Engagement in Group: Not Applicable  Additional Comments:  Patient did not attend group, patient remained in bed.  Vietta Bonifield Brittini 01/31/2012, 2:23 PM  

## 2012-01-31 NOTE — Progress Notes (Signed)
Patient ID: Alicia Zamora, female   DOB: 06-03-1970, 41 y.o.   MRN: [id number] Patient has order for d/c and denies any SI/HI. She verbalized understanding of follow up and medications. She received prescriptions for all medications. Pt exhibited stable mood during the discharge process and expressed readiness to leave.

## 2012-01-31 NOTE — Progress Notes (Signed)
D: Pt came to med window at 0000 to request PRN.  A: Pt given Ativan 1mg  PO and Restoril 7.5mg  PO. Medications administered according to med orders and POC.  Q15 minute safety checks maintained as per unit protocol.  Contract for safety renewed.  R: Pt retired to room and slept or rested for the remainder of the night.  Safety maintained. Dion Saucier RN

## 2012-01-31 NOTE — Progress Notes (Signed)
  Aftercare Planning Group: 01/31/2012 8:45 AM  Pt attended discharge planning group and actively participated in group.  SW provided pt with today's workbook.  Pt presents with a very energetic affect and excited demanor. Reports her depression and anxiety are all rated at a zero and she reports she is ready to go.  Alicia Zamora is very labile in mood as when group started she is very sociable and happy/speaking to everyone in the group, but as time goes on and group continues, she becomes irritable that she has not seen a doctor, does not know why she remains in the hospital and wants to leave today.  Alicia Zamora is redirected and educated regarding the process and wanting her to be safe with a safe dc plan before she leaves. Her feelings are validated and heard and she is appreciative.  Alicia Zamora reports she lives in [place] [facility] and will need a community referral for aftercare at dc. She reports some medical problems and already has a PCP in place at DC to follow up with.       Yacob Wilkerson Nail, LCSW 01/31/2012 10:16 AM
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
## 2012-01-31 NOTE — Progress Notes (Signed)
Grief and Loss Group  The group focused on a variety of loss issues including how loss of parents during childhood impacted their lives, losses resulting from addiction, losses of personal dreams and losses related to self expectation. Many members of the group were supportive of each other. There was also a discussion of what some of learned from their losses which has been of help to them.    Pt was attentive. She shared that her loss is her inability to manage her feelings the way she would like.   Dyke Maes

## 2012-02-03 NOTE — Progress Notes (Signed)
Patient Discharge Instructions:  After Visit Summary (AVS):   Faxed to:  02/03/12 Psychiatric Admission Assessment Note:   Faxed to:  02/03/12 Suicide Risk Assessment - Discharge Assessment:   Faxed to:  02/03/12 Faxed/Sent to the Next Level Care provider:  02/03/12 Next Level Care Provider Has Access to the EMR, 02/03/12 Faxed to [place] @ [phone] Records provided to Jorje Guild at the [facility] outpatient clinic via [facility]/ Epic Access  Jerelene Redden, 02/03/2012, 4:27 PM

## 2012-02-05 NOTE — Progress Notes (Signed)
Agree with assessment and plan Avital Dancy A. Iam Lipson, M.D. 

## 2012-02-05 NOTE — Discharge Summary (Signed)
Agree with assessment and plan Shalonda Sachse A. Emmons Toth, M.D. 

## 2012-02-22 ENCOUNTER — Ambulatory Visit (HOSPITAL_COMMUNITY): Payer: Self-pay | Admitting: Physician Assistant

## 2015-08-02 ENCOUNTER — Emergency Department (HOSPITAL_BASED_OUTPATIENT_CLINIC_OR_DEPARTMENT_OTHER)
Admission: EM | Admit: 2015-08-02 | Discharge: 2015-08-02 | Disposition: A | Discharge status: Home / Self Care | Attending: Dermatology | Admitting: Dermatology

## 2015-08-02 DIAGNOSIS — F3181 Bipolar II disorder: Secondary | ICD-10-CM | POA: Insufficient documentation

## 2015-08-02 DIAGNOSIS — F17200 Nicotine dependence, unspecified, uncomplicated: Secondary | ICD-10-CM | POA: Insufficient documentation

## 2015-08-02 DIAGNOSIS — N159 Renal tubulo-interstitial disease, unspecified: Secondary | ICD-10-CM | POA: Insufficient documentation

## 2015-08-02 DIAGNOSIS — Z5321 Procedure and treatment not carried out due to patient leaving prior to being seen by health care provider: Secondary | ICD-10-CM | POA: Insufficient documentation

## 2018-02-15 ENCOUNTER — Other Ambulatory Visit: Payer: Self-pay

## 2018-02-15 ENCOUNTER — Emergency Department (HOSPITAL_BASED_OUTPATIENT_CLINIC_OR_DEPARTMENT_OTHER)
Admission: EM | Admit: 2018-02-15 | Discharge: 2018-02-15 | Disposition: A | Discharge status: Home / Self Care | Payer: Medicaid Other | Attending: Emergency Medicine | Admitting: Emergency Medicine

## 2018-02-15 ENCOUNTER — Emergency Department (HOSPITAL_BASED_OUTPATIENT_CLINIC_OR_DEPARTMENT_OTHER): Payer: Medicaid Other

## 2018-02-15 ENCOUNTER — Encounter (HOSPITAL_BASED_OUTPATIENT_CLINIC_OR_DEPARTMENT_OTHER): Payer: Self-pay | Admitting: Respiratory Therapy

## 2018-02-15 DIAGNOSIS — F419 Anxiety disorder, unspecified: Secondary | ICD-10-CM | POA: Insufficient documentation

## 2018-02-15 DIAGNOSIS — R0600 Dyspnea, unspecified: Secondary | ICD-10-CM | POA: Diagnosis not present

## 2018-02-15 DIAGNOSIS — Z87891 Personal history of nicotine dependence: Secondary | ICD-10-CM | POA: Insufficient documentation

## 2018-02-15 DIAGNOSIS — R079 Chest pain, unspecified: Secondary | ICD-10-CM | POA: Diagnosis present

## 2018-02-15 DIAGNOSIS — F1010 Alcohol abuse, uncomplicated: Secondary | ICD-10-CM | POA: Diagnosis not present

## 2018-02-15 DIAGNOSIS — Z79899 Other long term (current) drug therapy: Secondary | ICD-10-CM | POA: Insufficient documentation

## 2018-02-15 DIAGNOSIS — Z91040 Latex allergy status: Secondary | ICD-10-CM | POA: Diagnosis not present

## 2018-02-15 DIAGNOSIS — Z9884 Bariatric surgery status: Secondary | ICD-10-CM | POA: Diagnosis not present

## 2018-02-15 DIAGNOSIS — F319 Bipolar disorder, unspecified: Secondary | ICD-10-CM | POA: Diagnosis not present

## 2018-02-15 HISTORY — DX: Alcohol abuse, uncomplicated: F10.10

## 2018-02-15 HISTORY — DX: Post-traumatic stress disorder, unspecified: F43.10

## 2018-02-15 HISTORY — DX: Unspecified convulsions: R56.9

## 2018-02-15 LAB — COMPREHENSIVE METABOLIC PANEL
ALBUMIN: 3.6 g/dL (ref 3.5–5.0)
ALT: 62 U/L — ABNORMAL HIGH (ref 0–44)
AST: 119 U/L — ABNORMAL HIGH (ref 15–41)
Alkaline Phosphatase: 134 U/L — ABNORMAL HIGH (ref 38–126)
Anion gap: 14 (ref 5–15)
BUN: <5 mg/dL — ABNORMAL LOW (ref 6–20)
CHLORIDE: 101 mmol/L (ref 98–111)
CO2: 19 mmol/L — ABNORMAL LOW (ref 22–32)
Calcium: 8.9 mg/dL (ref 8.9–10.3)
Creatinine, Ser: 0.59 mg/dL (ref 0.44–1.00)
GFR calc Af Amer: >60 mL/min (ref >60)
GFR calc non Af Amer: >60 mL/min (ref >60)
Glucose, Bld: 87 mg/dL (ref 70–99)
Potassium: 3.3 mmol/L — ABNORMAL LOW (ref 3.5–5.1)
SODIUM: 134 mmol/L — AB (ref 135–145)
Total Bilirubin: 1.1 mg/dL (ref 0.3–1.2)
Total Protein: 6.8 g/dL (ref 6.5–8.1)

## 2018-02-15 LAB — RAPID URINE DRUG SCREEN, HOSP PERFORMED
Amphetamines: POSITIVE — AB
Barbiturates: NOT DETECTED
Benzodiazepines: NOT DETECTED
Cocaine: NOT DETECTED
Opiates: NOT DETECTED
Tetrahydrocannabinol: POSITIVE — AB

## 2018-02-15 LAB — CBC WITH DIFFERENTIAL/PLATELET
Abs Immature Granulocytes: 0.03 10*3/uL (ref 0.00–0.07)
BASOS PCT: 0 %
Basophils Absolute: 0 10*3/uL (ref 0.0–0.1)
Eosinophils Absolute: 0.1 10*3/uL (ref 0.0–0.5)
Eosinophils Relative: 1 %
HCT: 36.9 % (ref 36.0–46.0)
Hemoglobin: 12.4 g/dL (ref 12.0–15.0)
Immature Granulocytes: 0 %
Lymphocytes Relative: 29 %
Lymphs Abs: 3 10*3/uL (ref 0.7–4.0)
MCH: 33.2 pg (ref 26.0–34.0)
MCHC: 33.6 g/dL (ref 30.0–36.0)
MCV: 98.7 fL (ref 80.0–100.0)
Monocytes Absolute: 1 10*3/uL (ref 0.1–1.0)
Monocytes Relative: 10 %
Neutro Abs: 6.1 10*3/uL (ref 1.7–7.7)
Neutrophils Relative %: 60 %
Platelets: 322 10*3/uL (ref 150–400)
RBC: 3.74 MIL/uL — AB (ref 3.87–5.11)
RDW: 14 % (ref 11.5–15.5)
WBC: 10.2 10*3/uL (ref 4.0–10.5)
nRBC: 0 % (ref 0.0–0.2)

## 2018-02-15 LAB — ETHANOL: Alcohol, Ethyl (B): 140 mg/dL — ABNORMAL HIGH (ref <10)

## 2018-02-15 LAB — URINALYSIS, ROUTINE W REFLEX MICROSCOPIC
Bilirubin Urine: NEGATIVE
Glucose, UA: NEGATIVE mg/dL
HGB URINE DIPSTICK: NEGATIVE
Ketones, ur: NEGATIVE mg/dL
Leukocytes, UA: NEGATIVE
Nitrite: NEGATIVE
Protein, ur: NEGATIVE mg/dL
Specific Gravity, Urine: <1.005 — ABNORMAL LOW (ref 1.005–1.030)
pH: 6 (ref 5.0–8.0)

## 2018-02-15 LAB — BRAIN NATRIURETIC PEPTIDE: B Natriuretic Peptide: 26.5 pg/mL (ref 0.0–100.0)

## 2018-02-15 LAB — TROPONIN I: Troponin I: <0.03 ng/mL (ref <0.03)

## 2018-02-15 MED ORDER — DEXAMETHASONE SODIUM PHOSPHATE 10 MG/ML IJ SOLN
16.0000 mg | Freq: Once | INTRAMUSCULAR | Status: AC
  Administered 2018-02-15: 16 mg
  Filled 2018-02-15: qty 2

## 2018-02-15 MED ORDER — DEXAMETHASONE 6 MG PO TABS: 6.0000 mg | ORAL_TABLET | Freq: Two times a day (BID) | ORAL | 0 refills | Status: AC

## 2018-02-15 NOTE — ED Notes (Signed)
Patient transported to X-ray 

## 2018-02-15 NOTE — ED Notes (Signed)
ED Provider at bedside. 

## 2018-02-15 NOTE — ED Triage Notes (Signed)
States," I only have one lung and my breath is restricted " Also states is a "Severe Alcoholic" had a beer prior to arrival

## 2018-02-15 NOTE — ED Provider Notes (Signed)
Emergency Department Provider Note   I have reviewed the triage vital signs and the nursing notes.   HISTORY  Chief Complaint Shortness of Breath   HPI Alicia Zamora is a 47 y.o. female with multiple past medical problems as documented below the presents emergency department today with patient feeling like her upper chest is tight.  She has had a lobectomy for lung abscess in the past.  States she is been having some cough and shortness of breath.  No other associated symptoms.  No chest pain.  No back pain.  No abdominal pain.  No new vomiting or diarrhea. No other associated or modifying symptoms.    Past Medical History:  Diagnosis Date  . Alcohol abuse   . Anemia    (refuses blood-[place] [facility])  . Anxiety 2004   PTSD  . Bipolar 2 disorder ([place])   . Depression 2004  . PTSD (post-traumatic stress disorder)   . Seizures [id number])     Patient Active Problem List   Diagnosis Date Noted  . Post traumatic stress disorder 01/27/2012  . Iron deficiency anemia 01/25/2012  . Refusal of blood transfusions as patient is Jehovah's Witness 01/25/2012  . S/P gastric bypass 01/25/2012  . Depression 01/25/2012  . Polysubstance abuse ([place]) 01/25/2012    Past Surgical History:  Procedure Laterality Date  . ABDOMINAL HYSTERECTOMY    . APPENDECTOMY    . C sections  x 2   . DILATION AND CURETTAGE OF UTERUS  2008  . GASTRIC BYPASS  July 05, 2010  . HERNIA REPAIR    . LUNG LOBECTOMY Right   . TONSILLECTOMY  1974    Current Outpatient Rx  . Order #: [id number] Class: Normal  . Order #: [id number] Class: Historical Med  . Order #: [id number] Class: Normal  . Order #: [id number] Class: Normal  . Order #: [id number] Class: Normal  . Order #: [id number] Class: Print  . Order #: [id number] Class: Normal  . Order #: [id number] Class: Normal  . Order #: [id number] Class: Normal  . Order #: [id number] Class: Historical Med  . Order #: [id number] Class: Normal  . Order #: [id number] Class: Historical Med  . Order  #: [id number] Class: Normal  . Order #: [id number] Class: Historical Med  . Order #: [id number] Class: Normal  . Order #: [id number] Class: Normal  . Order #: [id number] Class: Normal  . Order #: [id number] Class: Normal    Allergies Iodinated diagnostic agents; Penicillins; Ketorolac tromethamine; Latex; and Nsaids  No family history on file.  Social History Social History   Tobacco Use  . Smoking status: Former Research scientist (life sciences)  . Smokeless tobacco: Never Used  Substance Use Topics  . Alcohol use: Yes    Alcohol/week: 24.0 standard drinks    Types: 24 Cans of beer per week    Comment: Also a pint liquor  . Drug use: Not Currently    Review of Systems  All other systems negative except as documented in the HPI. All pertinent positives and negatives as reviewed in the HPI. ____________________________________________   PHYSICAL EXAM:  VITAL SIGNS: ED Triage Vitals  Enc Vitals Group     BP 02/15/18 1522 (!) 130/101     Pulse Rate 02/15/18 1522 87     Resp 02/15/18 1522 (!) 26     Temp 02/15/18 1522 98.5 F (36.9 C)     Temp Source 02/15/18 1522 Oral     SpO2 02/15/18 1522 100 %     Weight 02/15/18 1523 155 lb (70.3 kg)     Height 02/15/18 1523 5\' 9"  (1.753 m)  Constitutional: Alert and oriented. Well appearing and in no acute distress.  Anxious. Eyes: Conjunctivae are normal. PERRL. EOMI. Head: Atraumatic. Nose: No congestion/rhinnorhea. Mouth/Throat: Mucous membranes are moist.  Oropharynx non-erythematous. Neck: No stridor.  No meningeal signs.   Cardiovascular: Normal rate, regular rhythm. Good peripheral circulation. Grossly normal heart sounds.   Respiratory: Normal respiratory effort.  No retractions. Lungs CTAB. Gastrointestinal: Soft and nontender. No distention.  Musculoskeletal: No lower extremity tenderness nor edema. No gross deformities of extremities. Neurologic:  Normal speech and language. No gross focal neurologic deficits are appreciated.  Skin:  Skin is warm, dry  and intact. No rash noted.   ____________________________________________   LABS (all labs ordered are listed, but only abnormal results are displayed)  Labs Reviewed  CBC WITH DIFFERENTIAL/PLATELET - Abnormal; Notable for the following components:      Result Value   RBC 3.74 (*)    All other components within normal limits  COMPREHENSIVE METABOLIC PANEL - Abnormal; Notable for the following components:   Sodium 134 (*)    Potassium 3.3 (*)    CO2 19 (*)    BUN <5 (*)    AST 119 (*)    ALT 62 (*)    Alkaline Phosphatase 134 (*)    All other components within normal limits  ETHANOL - Abnormal; Notable for the following components:   Alcohol, Ethyl (B) 140 (*)    All other components within normal limits  RAPID URINE DRUG SCREEN, HOSP PERFORMED - Abnormal; Notable for the following components:   Amphetamines POSITIVE (*)    Tetrahydrocannabinol POSITIVE (*)    All other components within normal limits  URINALYSIS, ROUTINE W REFLEX MICROSCOPIC - Abnormal; Notable for the following components:   Color, Urine STRAW (*)    Specific Gravity, Urine <1.005 (*)    All other components within normal limits  TROPONIN I  BRAIN NATRIURETIC PEPTIDE   ____________________________________________  EKG   EKG Interpretation  Date/Time:  Thursday February 15 2018 15:31:05 EST Ventricular Rate:  89 PR Interval:    QRS Duration: 82 QT Interval:  427 QTC Calculation: 520 R Axis:   70 Text Interpretation:  Sinus rhythm ST elev, probable normal early repol pattern Prolonged QT interval No old tracing to compare Confirmed by Merrily Pew [phone]) on 02/15/2018 3:34:33 PM       ____________________________________________  RADIOLOGY  Dg Chest 2 View  Result Date: 02/15/2018 CLINICAL DATA:  Shortness of breath EXAM: CHEST - 2 VIEW COMPARISON:  None. FINDINGS: There is increased density in the lateral aspect of the right upper lung. There is a small right effusion with underlying  atelectasis. No definite left-sided effusion. The heart, hila, and mediastinum are normal. No pneumothorax identified. No nodules or infiltrates. IMPRESSION: 1. Increased density in the lateral aspect of the right lung could represent a pleural plaque or loculated fluid. A mass is possible. Recommend a CT scan for further evaluation. 2. Small right pleural effusion with underlying atelectasis. 3. No other acute abnormalities. Electronically Signed   By: Dorise Bullion III M.D   On: 02/15/2018 15:54   Ct Chest Wo Contrast  Result Date: 02/15/2018 CLINICAL DATA:  Shortness of breath. Increased density in the lateral aspect of the right lung on chest radiographs earlier today. History of right lung lobectomy and gastric bypass. Ex-smoker. EXAM: CT CHEST WITHOUT CONTRAST TECHNIQUE: Multidetector CT imaging of the chest was performed following the standard protocol without IV contrast. COMPARISON:  Chest radiographs obtained earlier today. FINDINGS: Cardiovascular: No  significant vascular findings. Normal heart size. No pericardial effusion. Mediastinum/Nodes: No enlarged mediastinal or axillary lymph nodes. Thyroid gland, trachea, and esophagus demonstrate no significant findings. Lungs/Pleura: Posterolateral post thoracotomy rib changes on the right with adjacent pleural based soft tissue thickening and a coarse, oval calcification forming the mass-like density seen radiographically. The mass-like component measures 2.0 x 1.1 cm on image number 64 series 2. A 4 mm nodule is noted in the right upper lobe on image number 37 series 4. Small amount of linear atelectasis or scarring at the anterolateral right lung base. Mild bilateral bullous changes with centrilobular emphysema. Upper Abdomen: Marked diffuse low density of the liver relative to the spleen. Post gastric bypass changes. Musculoskeletal: Right posterolateral postthoracotomy changes. Mild thoracic spine degenerative changes. IMPRESSION: 1. Posterolateral  post thoracotomy rib changes on the right with adjacent pleural based soft tissue thickening forming the mass-like density seen radiographically. This has a CT appearance most compatible with post thoracotomy pleural scarring. 2. 4 mm right upper lobe nodule. Unless this could be shown stable compared to previous chest CTs, a follow-up chest CT without contrast would be recommended in 1 year. This recommendation follows the consensus statement: Guidelines for Management of Incidental Pulmonary Nodules Detected on CT Images: From the Fleischner Society 2017; Radiology 2017; 284:228-243. 3. Mild changes of COPD. 4. Marked diffuse hepatic steatosis. Emphysema (ICD10-J43.9). Electronically Signed   By: Claudie Revering M.D.   On: 02/15/2018 17:52    ____________________________________________   PROCEDURES  Procedure(s) performed:   Procedures   ____________________________________________   INITIAL IMPRESSION / ASSESSMENT AND PLAN / ED COURSE  Patient could have recurrence of her previous illness, COPD exacerbation as she recently quit smoking or could also be cardiac in cause however I think the most likely etiology is anxiety related. PERC negative.   Reevaluation patient is resting and breathing comfortably with normal VS. When I awaken her she starts breathing fast again but is no long complaining of any distress.   CT without contrast done secondary to reported anaphylactic allergy to contrast.  Showed pleural thickening consistent with scarring from previous surgeries.  Mild COPD changes.  Will continue on course of steroids.  Patient is able to ambulate without hypoxia or tachypnea.  Stable for discharge.   Pertinent labs & imaging results that were available during my care of the patient were reviewed by me and considered in my medical decision making (see chart for details).  ____________________________________________  FINAL CLINICAL IMPRESSION(S) / ED DIAGNOSES  Final diagnoses:    Dyspnea, unspecified type     MEDICATIONS GIVEN DURING THIS VISIT:  Medications  dexamethasone (DECADRON) injection 16 mg (16 mg Other Given 02/15/18 1559)     NEW OUTPATIENT MEDICATIONS STARTED DURING THIS VISIT:  New Prescriptions   DEXAMETHASONE (DECADRON) 6 MG TABLET    Take 1 tablet (6 mg total) by mouth 2 (two) times daily with a meal for 4 days.    Note:  This note was prepared with assistance of Dragon voice recognition software. Occasional wrong-word or sound-a-like substitutions may have occurred due to the inherent limitations of voice recognition software.   Merrily Pew, MD 02/15/18 [phone]

## 2018-02-15 NOTE — ED Notes (Signed)
Pt ambulated around department with steady gate. Pt SpO2 remained at 97% and heartrate 74.

## 2018-03-05 ENCOUNTER — Emergency Department (HOSPITAL_BASED_OUTPATIENT_CLINIC_OR_DEPARTMENT_OTHER): Payer: Medicaid Other

## 2018-03-05 ENCOUNTER — Other Ambulatory Visit: Payer: Self-pay

## 2018-03-05 ENCOUNTER — Encounter (HOSPITAL_BASED_OUTPATIENT_CLINIC_OR_DEPARTMENT_OTHER): Payer: Self-pay

## 2018-03-05 ENCOUNTER — Emergency Department (HOSPITAL_BASED_OUTPATIENT_CLINIC_OR_DEPARTMENT_OTHER)
Admission: EM | Admit: 2018-03-05 | Discharge: 2018-03-05 | Disposition: A | Discharge status: Home / Self Care | Payer: Medicaid Other | Attending: Emergency Medicine | Admitting: Emergency Medicine

## 2018-03-05 DIAGNOSIS — M899 Disorder of bone, unspecified: Secondary | ICD-10-CM | POA: Insufficient documentation

## 2018-03-05 DIAGNOSIS — Z9884 Bariatric surgery status: Secondary | ICD-10-CM | POA: Insufficient documentation

## 2018-03-05 DIAGNOSIS — F17200 Nicotine dependence, unspecified, uncomplicated: Secondary | ICD-10-CM | POA: Insufficient documentation

## 2018-03-05 DIAGNOSIS — R945 Abnormal results of liver function studies: Secondary | ICD-10-CM | POA: Diagnosis not present

## 2018-03-05 DIAGNOSIS — Z79899 Other long term (current) drug therapy: Secondary | ICD-10-CM | POA: Diagnosis not present

## 2018-03-05 DIAGNOSIS — R531 Weakness: Secondary | ICD-10-CM | POA: Insufficient documentation

## 2018-03-05 DIAGNOSIS — R1013 Epigastric pain: Secondary | ICD-10-CM | POA: Diagnosis not present

## 2018-03-05 DIAGNOSIS — F1010 Alcohol abuse, uncomplicated: Secondary | ICD-10-CM | POA: Insufficient documentation

## 2018-03-05 DIAGNOSIS — R7401 Elevation of levels of liver transaminase levels: Secondary | ICD-10-CM

## 2018-03-05 DIAGNOSIS — R112 Nausea with vomiting, unspecified: Secondary | ICD-10-CM | POA: Diagnosis present

## 2018-03-05 DIAGNOSIS — Z91040 Latex allergy status: Secondary | ICD-10-CM | POA: Insufficient documentation

## 2018-03-05 DIAGNOSIS — R7989 Other specified abnormal findings of blood chemistry: Secondary | ICD-10-CM

## 2018-03-05 DIAGNOSIS — R740 Nonspecific elevation of levels of transaminase and lactic acid dehydrogenase [LDH]: Secondary | ICD-10-CM | POA: Diagnosis not present

## 2018-03-05 LAB — COMPREHENSIVE METABOLIC PANEL
ALT: 131 U/L — ABNORMAL HIGH (ref 0–44)
AST: 324 U/L — ABNORMAL HIGH (ref 15–41)
Albumin: 3.8 g/dL (ref 3.5–5.0)
Alkaline Phosphatase: 148 U/L — ABNORMAL HIGH (ref 38–126)
Anion gap: 14 (ref 5–15)
BUN: 6 mg/dL (ref 6–20)
CO2: 25 mmol/L (ref 22–32)
Calcium: 9.1 mg/dL (ref 8.9–10.3)
Chloride: 97 mmol/L — ABNORMAL LOW (ref 98–111)
Creatinine, Ser: 0.51 mg/dL (ref 0.44–1.00)
GFR calc Af Amer: >60 mL/min (ref >60)
GFR calc non Af Amer: >60 mL/min (ref >60)
GLUCOSE: 82 mg/dL (ref 70–99)
Potassium: 3.3 mmol/L — ABNORMAL LOW (ref 3.5–5.1)
Sodium: 136 mmol/L (ref 135–145)
Total Bilirubin: 2.5 mg/dL — ABNORMAL HIGH (ref 0.3–1.2)
Total Protein: 7.4 g/dL (ref 6.5–8.1)

## 2018-03-05 LAB — CBC WITH DIFFERENTIAL/PLATELET
ABS IMMATURE GRANULOCYTES: 0.02 10*3/uL (ref 0.00–0.07)
Basophils Absolute: 0 10*3/uL (ref 0.0–0.1)
Basophils Relative: 1 %
Eosinophils Absolute: 0 10*3/uL (ref 0.0–0.5)
Eosinophils Relative: 1 %
HEMATOCRIT: 39.6 % (ref 36.0–46.0)
Hemoglobin: 12.9 g/dL (ref 12.0–15.0)
Immature Granulocytes: 0 %
LYMPHS ABS: 1 10*3/uL (ref 0.7–4.0)
Lymphocytes Relative: 11 %
MCH: 33.2 pg (ref 26.0–34.0)
MCHC: 32.6 g/dL (ref 30.0–36.0)
MCV: 101.8 fL — ABNORMAL HIGH (ref 80.0–100.0)
MONOS PCT: 8 %
Monocytes Absolute: 0.7 10*3/uL (ref 0.1–1.0)
Neutro Abs: 7 10*3/uL (ref 1.7–7.7)
Neutrophils Relative %: 79 %
Platelets: 434 10*3/uL — ABNORMAL HIGH (ref 150–400)
RBC: 3.89 MIL/uL (ref 3.87–5.11)
RDW: 14.1 % (ref 11.5–15.5)
WBC: 8.7 10*3/uL (ref 4.0–10.5)
nRBC: 0 % (ref 0.0–0.2)

## 2018-03-05 LAB — I-STAT CG4 LACTIC ACID, ED: Lactic Acid, Venous: 1.28 mmol/L (ref 0.5–1.9)

## 2018-03-05 LAB — PROTIME-INR
INR: 0.95
Prothrombin Time: 12.6 seconds (ref 11.4–15.2)

## 2018-03-05 LAB — LIPASE, BLOOD: LIPASE: 39 U/L (ref 11–51)

## 2018-03-05 LAB — MAGNESIUM: MAGNESIUM: 2.1 mg/dL (ref 1.7–2.4)

## 2018-03-05 LAB — TROPONIN I: Troponin I: <0.03 ng/mL (ref <0.03)

## 2018-03-05 LAB — APTT: aPTT: 26 seconds (ref 24–36)

## 2018-03-05 MED ORDER — THIAMINE HCL 100 MG/ML IJ SOLN
100.0000 mg | Freq: Every day | INTRAMUSCULAR | Status: DC
  Filled 2018-03-05: qty 2

## 2018-03-05 MED ORDER — LORAZEPAM 2 MG/ML IJ SOLN: 0.0000 mg | Freq: Four times a day (QID) | INTRAMUSCULAR | Status: DC

## 2018-03-05 MED ORDER — ONDANSETRON HCL 4 MG/2ML IJ SOLN
4.0000 mg | Freq: Once | INTRAMUSCULAR | Status: AC
  Administered 2018-03-05: 4 mg via INTRAVENOUS
  Filled 2018-03-05: qty 2

## 2018-03-05 MED ORDER — LORAZEPAM 1 MG PO TABS: 0.0000 mg | ORAL_TABLET | Freq: Four times a day (QID) | ORAL | Status: DC

## 2018-03-05 MED ORDER — LORAZEPAM 2 MG/ML IJ SOLN: 0.0000 mg | Freq: Two times a day (BID) | INTRAMUSCULAR | Status: DC

## 2018-03-05 MED ORDER — SODIUM CHLORIDE 0.9 % IV BOLUS
1000.0000 mL | Freq: Once | INTRAVENOUS | Status: AC
  Administered 2018-03-05: 1000 mL via INTRAVENOUS

## 2018-03-05 MED ORDER — VITAMIN B-1 100 MG PO TABS
100.0000 mg | ORAL_TABLET | Freq: Every day | ORAL | Status: DC
  Administered 2018-03-05: 100 mg via ORAL
  Filled 2018-03-05: qty 1

## 2018-03-05 MED ORDER — LORAZEPAM 1 MG PO TABS: 0.0000 mg | ORAL_TABLET | Freq: Two times a day (BID) | ORAL | Status: DC

## 2018-03-05 NOTE — ED Notes (Signed)
Pt asking to leave, took herself off the monitor

## 2018-03-05 NOTE — ED Notes (Signed)
Patient transported to Ultrasound 

## 2018-03-05 NOTE — ED Notes (Signed)
Pt verbalizes understanding of d/c instructions and denies any further needs at this time. 

## 2018-03-05 NOTE — ED Provider Notes (Addendum)
[place] EMERGENCY DEPARTMENT Provider Note   CSN: [id number] Arrival date & time: 03/05/18  1200     History   Chief Complaint Chief Complaint  Patient presents with  . Emesis    HPI Alicia Zamora is a 47 y.o. female.  HPI  47 year old female with history of alcoholism and gastric bypass comes in with chief complaint of vomiting. Patient reports that she started having nausea, vomiting and generalized weakness about 2 days ago.  She stopped drinking alcohol at that time.  Since then she has had persistent nausea with vomiting.  She is also having generalized abdominal pain and cough.  Patient also reports nonspecific chills, malaise.  She reports that she had quit drinking around Thanksgiving for 1 week and did not have severe withdrawal symptoms, however in the past when she had stopped drinking long-term she has had significant withdrawal symptoms including seizures.  Past Medical History:  Diagnosis Date  . Alcohol abuse   . Anemia    (refuses blood-[place] Witness)  . Anxiety 2004   PTSD  . Bipolar 2 disorder ([place])   . Depression 2004  . PTSD (post-traumatic stress disorder)   . Seizures [facility])     Patient Active Problem List   Diagnosis Date Noted  . Post traumatic stress disorder 01/27/2012  . Iron deficiency anemia 01/25/2012  . Refusal of blood transfusions as patient is Jehovah's Witness 01/25/2012  . S/P gastric bypass 01/25/2012  . Depression 01/25/2012  . Polysubstance abuse ([place]) 01/25/2012    Past Surgical History:  Procedure Laterality Date  . ABDOMINAL HYSTERECTOMY    . APPENDECTOMY    . C sections  x 2   . DILATION AND CURETTAGE OF UTERUS  2008  . GASTRIC BYPASS  July 05, 2010  . HERNIA REPAIR    . LUNG LOBECTOMY Right   . TONSILLECTOMY  1974     OB History   No obstetric history on file.      Home Medications    Prior to Admission medications   Medication Sig Start Date End Date Taking? Authorizing Provider    ARIPiprazole (ABILIFY) 10 MG tablet Take 1 tablet (10 mg total) by mouth daily. 01/31/12   Patrecia Pour, NP  busPIRone (BUSPAR) 15 MG tablet Take 15 mg by mouth 3 (three) times daily as needed. For anxiety    [provider]  calcium citrate (CALCITRATE - DOSED IN MG ELEMENTAL CALCIUM) 950 MG tablet Take 1 tablet (200 mg of elemental calcium total) by mouth 2 (two) times daily. 01/31/12   Patrecia Pour, NP  Cyanocobalamin (VITAMIN B-12) 1000 MCG/15ML LIQD Take 15 mLs (1,000 mcg total) by mouth 2 (two) times a week. 01/31/12   Patrecia Pour, NP  cyanocobalamin 1000 MCG tablet Take 1 tablet (1,000 mcg total) by mouth daily. 01/31/12   Patrecia Pour, NP  divalproex (DEPAKOTE ER) 250 MG 24 hr tablet Take 1 tablet (250 mg total) by mouth 2 (two) times daily. 01/31/12   Patrecia Pour, NP  esomeprazole (NEXIUM) 40 MG capsule Take 1 capsule (40 mg total) by mouth 2 (two) times daily. 01/31/12   Patrecia Pour, NP  ferrous sulfate 325 (65 FE) MG tablet Take 1 tablet (325 mg total) by mouth 3 (three) times daily with meals. 01/31/12   Patrecia Pour, NP  LORazepam (ATIVAN) 1 MG tablet Take 1 mg by mouth 4 (four) times daily as needed. As needed for anxiety and hives    [provider]  Multiple Vitamin (MULTIVITAMIN) tablet Take 1 tablet by mouth 2 (two) times daily. 01/31/12   Patrecia Pour, NP  oxycodone (OXY-IR) 5 MG capsule Take 5 mg by mouth 3 (three) times daily as needed.    [provider]  pantoprazole (PROTONIX) 40 MG tablet Take 1 tablet (40 mg total) by mouth 2 (two) times daily before a meal. 01/31/12   Lord, Asa Saunas, NP  temazepam (RESTORIL) 7.5 MG capsule Take 7.5 mg by mouth at bedtime as needed. For insomnia    [provider]  thiamine (VITAMIN B-1) 50 MG tablet Take 1 tablet (50 mg total) by mouth daily. 01/31/12   Patrecia Pour, NP  ursodiol (ACTIGALL) 500 MG tablet Take 1 tablet (500 mg total) by mouth 2 (two) times daily. 01/31/12    Patrecia Pour, NP  Vitamin D, Ergocalciferol, (DRISDOL) 50000 UNITS CAPS Take 1 capsule (50,000 Units total) by mouth 2 (two) times a week. mon & fri 01/31/12   Patrecia Pour, NP  vitamin k 100 MCG tablet Take 1 tablet (100 mcg total) by mouth daily. 01/31/12   Patrecia Pour, NP    Family History No family history on file.  Social History Social History   Tobacco Use  . Smoking status: Current Every Day Smoker  . Smokeless tobacco: Never Used  Substance Use Topics  . Alcohol use: Yes    Alcohol/week: 24.0 standard drinks    Types: 24 Cans of beer per week    Comment: Also a pint liquor  . Drug use: Not Currently     Allergies   Iodinated diagnostic agents; Penicillins; Ketorolac tromethamine; Latex; and Nsaids   Review of Systems Review of Systems  Constitutional: Positive for activity change.  Respiratory: Positive for cough.   Cardiovascular: Positive for chest pain.  Gastrointestinal: Positive for abdominal pain.  Genitourinary: Negative for dysuria.  Allergic/Immunologic: Negative for immunocompromised state.  All other systems reviewed and are negative.    Physical Exam Updated Vital Signs BP 120/79   Pulse 73   Temp 98.4 F (36.9 C) (Oral)   Resp 18   LMP 01/11/2012   SpO2 100%   Physical Exam Vitals signs and nursing note reviewed.  Constitutional:      Appearance: She is well-developed. She is ill-appearing.  HENT:     Head: Normocephalic and atraumatic.     Mouth/Throat:     Mouth: Mucous membranes are moist.  Eyes:     General: No scleral icterus.    Conjunctiva/sclera: Conjunctivae normal.     Pupils: Pupils are equal, round, and reactive to light.  Neck:     Musculoskeletal: Normal range of motion and neck supple.  Cardiovascular:     Rate and Rhythm: Normal rate and regular rhythm.     Heart sounds: Normal heart sounds.  Pulmonary:     Effort: Pulmonary effort is normal. No respiratory distress.     Breath sounds: Normal breath  sounds. No wheezing, rhonchi or rales.  Abdominal:     General: Bowel sounds are normal. There is no distension.     Palpations: Abdomen is soft.     Tenderness: There is abdominal tenderness. There is no guarding or rebound.     Comments: Generalized abdominal tenderness in the upper quadrants, worst in the epigastric region  Skin:    General: Skin is warm and dry.  Neurological:     Comments: Somnolent      ED Treatments / Results  Labs (all labs ordered are listed, but only abnormal results are displayed) Labs Reviewed  COMPREHENSIVE METABOLIC PANEL - Abnormal; Notable for the following components:      Result Value   Potassium 3.3 (*)    Chloride 97 (*)    AST 324 (*)    ALT 131 (*)    Alkaline Phosphatase 148 (*)    Total Bilirubin 2.5 (*)    All other components within normal limits  CBC WITH DIFFERENTIAL/PLATELET - Abnormal; Notable for the following components:   MCV 101.8 (*)    Platelets 434 (*)    All other components within normal limits  TROPONIN I  APTT  PROTIME-INR  MAGNESIUM  LIPASE, BLOOD  URINALYSIS, ROUTINE W REFLEX MICROSCOPIC  I-STAT CG4 LACTIC ACID, ED    EKG EKG Interpretation  Date/Time:  Monday March 05 2018 13:01:06 EST Ventricular Rate:  64 PR Interval:    QRS Duration: 90 QT Interval:  508 QTC Calculation: 525 R Axis:   48 Text Interpretation:  Sinus rhythm Prolonged QT interval No acute changes No significant change since last tracing Confirmed by Varney Biles ([id number]) on 03/05/2018 3:38:04 PM   Radiology Dg Chest 2 View  Result Date: 03/05/2018 CLINICAL DATA:  Cough and chest pain EXAM: CHEST - 2 VIEW COMPARISON:  Chest radiograph and chest CT February 15, 2018 FINDINGS: There is scarring in the right base. There is also parenchymal lung scarring in the right upper lung region near a prior fracture of the posterolateral right sixth rib. Lungs elsewhere are clear. Heart size and pulmonary vascularity are normal. No adenopathy.  No pneumothorax. There is lucency in the right posterolateral sixth rib fracture region. IMPRESSION: Areas of scarring on the right. Previous right sixth rib fracture. No edema or consolidation. Stable cardiac silhouette. Note that there is lucency in the bone at the site of the right sixth rib fracture. This lucency may be of postoperative etiology. Localized rib destruction in this area, however, cannot be excluded radiographically. Recent CT suggests that there may be rib destruction in this area, particularly on coronal imaging. It may be prudent to consider nuclear medicine bone scan to assess for abnormal metabolic activity in this area. Based on the current examination, a focus of infection or neoplasm involving this rib cannot be excluded. Electronically Signed   By: Lowella Grip III M.D.   On: 03/05/2018 13:16    Procedures Procedures (including critical care time)  Medications Ordered in ED Medications  LORazepam (ATIVAN) injection 0-4 mg (has no administration in time range)    Or  LORazepam (ATIVAN) tablet 0-4 mg (has no administration in time range)  LORazepam (ATIVAN) injection 0-4 mg (has no administration in time range)    Or  LORazepam (ATIVAN) tablet 0-4 mg (has no administration in time range)  thiamine (VITAMIN B-1) tablet 100 mg (100 mg Oral Given 03/05/18 1350)    Or  thiamine (B-1) injection 100 mg ( Intravenous See Alternative 03/05/18 1350)  sodium chloride 0.9 % bolus 1,000 mL (0 mLs Intravenous Stopped 03/05/18 1600)  ondansetron (ZOFRAN) injection 4 mg (4 mg Intravenous Given 03/05/18 1604)     Initial Impression / Assessment and Plan / ED Course  I have reviewed the triage vital signs and the nursing notes.  Pertinent labs & imaging results that were available during my care of the patient were reviewed by me and considered in my medical decision making (see chart for details).  Clinical Course as of Mar 05 1713  Mon  Mar 05, 2018  1713 Discussed the  results of the rib findings with the patient and informed her to see her pcp for it.  Dr. Maryan Rued to f/u on [place]. If [place] is normal and pt has persistent pain or fails oral challenge - then she will need CT.   [AN]    Clinical Course User Index [AN] Varney Biles, MD    Patient comes in with chief complaint of cough and weakness. She has history of alcoholism and gastric bypass surgery.  On exam she is noted to have abdominal tenderness in the upper quadrants.  She is coughing, however there is no associated abnormal lung sounds.  Differential diagnosis includes pneumonia, flu, UTI, gastritis, lecture light abnormality, AKI. We will start hydration and reassess.  She is not having severe withdrawals right now.  5:14 PM Patient was reassessed after LFTs returned.  Overall the labs are reassuring besides the LFTs, which are all elevated.  Bilirubin is greater than 2.  I will add lipase to her work-up.  Given the history of gastric bypass surgery and heavy alcohol use, gastritis/peptic ulcer disease is possible.  Pancreatitis is also a possibility given the epigastric pain. With the elevated LFTs, there could be choledocholithiasis, cholelithiasis or liver cirrhosis /hepatitis.  Ultrasound right upper quadrant has been ordered along with lipase. Claudine Mouton has been canceled as patient on reassessment is alert, awake and not toxic appearing.  Results from the ER have been discussed with the patient and his daughter.  Patient states that she would have not stop drinking if it were not for her being sick.  Does not appear that she is ready to stop using alcohol at this time, however we have advised and encouraged her to consider stopping alcohol use as soon as possible  Chest x-ray is showing nonspecific changes around the sixth rib. These results have also been discussed with the patient.  I have advised her to follow-up with her primary care doctor for further evaluation for this non specific changes  around the 6th rib.   Final Clinical Impressions(s) / ED Diagnoses   Final diagnoses:  Elevated LFTs  Transaminitis  Rib lesion    ED Discharge Orders    None       Varney Biles, MD 03/05/18 [id number]    Varney Biles, MD 03/05/18 1714

## 2018-03-05 NOTE — ED Notes (Signed)
Pt sleeping. 

## 2018-03-05 NOTE — ED Notes (Signed)
ED Provider at bedside. 

## 2018-03-05 NOTE — ED Triage Notes (Signed)
Pt c/o n/v/d x 3 days-also pt is alcoholic and no ETOH x 2 days-to triage in w/c

## 2018-03-05 NOTE — ED Notes (Signed)
Pt returned from US

## 2018-03-05 NOTE — ED Provider Notes (Signed)
u/s without acute findings.  Patient has been able to tolerate p.o.'s here and is wanting to go home.   Blanchie Dessert, MD 03/05/18 [phone]

## 2018-05-14 ENCOUNTER — Other Ambulatory Visit: Payer: Self-pay

## 2018-05-14 ENCOUNTER — Emergency Department (HOSPITAL_BASED_OUTPATIENT_CLINIC_OR_DEPARTMENT_OTHER)
Admission: EM | Admit: 2018-05-14 | Discharge: 2018-05-14 | Disposition: A | Discharge status: Home / Self Care | Payer: Medicaid Other | Attending: Emergency Medicine | Admitting: Emergency Medicine

## 2018-05-14 ENCOUNTER — Encounter (HOSPITAL_BASED_OUTPATIENT_CLINIC_OR_DEPARTMENT_OTHER): Payer: Self-pay | Admitting: Emergency Medicine

## 2018-05-14 DIAGNOSIS — M79645 Pain in left finger(s): Secondary | ICD-10-CM | POA: Diagnosis present

## 2018-05-14 DIAGNOSIS — L03012 Cellulitis of left finger: Secondary | ICD-10-CM | POA: Diagnosis not present

## 2018-05-14 DIAGNOSIS — F17210 Nicotine dependence, cigarettes, uncomplicated: Secondary | ICD-10-CM | POA: Insufficient documentation

## 2018-05-14 DIAGNOSIS — Z79899 Other long term (current) drug therapy: Secondary | ICD-10-CM | POA: Insufficient documentation

## 2018-05-14 DIAGNOSIS — Z23 Encounter for immunization: Secondary | ICD-10-CM | POA: Insufficient documentation

## 2018-05-14 MED ORDER — HYDROCODONE-ACETAMINOPHEN 5-325 MG PO TABS: 1.0000 | ORAL_TABLET | Freq: Four times a day (QID) | ORAL | 0 refills | Status: DC | PRN

## 2018-05-14 MED ORDER — SULFAMETHOXAZOLE-TRIMETHOPRIM 800-160 MG PO TABS: 1.0000 | ORAL_TABLET | Freq: Two times a day (BID) | ORAL | 0 refills | Status: AC

## 2018-05-14 MED ORDER — LIDOCAINE HCL (PF) 1 % IJ SOLN
10.0000 mL | Freq: Once | INTRAMUSCULAR | Status: AC
  Administered 2018-05-14: 10 mL
  Filled 2018-05-14: qty 10

## 2018-05-14 MED ORDER — TETANUS-DIPHTH-ACELL PERTUSSIS 5-2.5-18.5 LF-MCG/0.5 IM SUSP
0.5000 mL | Freq: Once | INTRAMUSCULAR | Status: AC
  Administered 2018-05-14: 0.5 mL via INTRAMUSCULAR
  Filled 2018-05-14: qty 0.5

## 2018-05-14 MED ORDER — CEPHALEXIN 500 MG PO CAPS: 500.0000 mg | ORAL_CAPSULE | Freq: Two times a day (BID) | ORAL | 0 refills | Status: DC

## 2018-05-14 NOTE — ED Provider Notes (Signed)
[facility] [facility] EMERGENCY DEPARTMENT Provider Note   CSN: [id number] Arrival date & time: 05/14/18  1551    History   Chief Complaint Chief Complaint  Patient presents with  . Finger Injury    HPI Alicia Zamora is a 48 y.o. female with history of bipolar 2 disorder, alcohol abuse, PTSD, seizures who presents with left thumb pain after she accidentally got a screw jammed up her nail few days ago when she was reaching into a tool bag.  It was rusty.  She has had intermittent drainage from a blister right behind her nail.  She has full range of motion of her finger.     HPI  Past Medical History:  Diagnosis Date  . Alcohol abuse   . Anemia    (refuses blood-[place] [employer])  . Anxiety 2004   PTSD  . Bipolar 2 disorder ([place])   . Depression 2004  . PTSD (post-traumatic stress disorder)   . Seizures [facility])     Patient Active Problem List   Diagnosis Date Noted  . Post traumatic stress disorder 01/27/2012  . Iron deficiency anemia 01/25/2012  . Refusal of blood transfusions as patient is Jehovah's Witness 01/25/2012  . S/P gastric bypass 01/25/2012  . Depression 01/25/2012  . Polysubstance abuse ([place]) 01/25/2012    Past Surgical History:  Procedure Laterality Date  . ABDOMINAL HYSTERECTOMY    . APPENDECTOMY    . C sections  x 2   . DILATION AND CURETTAGE OF UTERUS  2008  . GASTRIC BYPASS  July 05, 2010  . HERNIA REPAIR    . LUNG LOBECTOMY Right   . TONSILLECTOMY  1974     OB History   No obstetric history on file.      Home Medications    Prior to Admission medications   Medication Sig Start Date End Date Taking? Authorizing Provider  ARIPiprazole (ABILIFY) 10 MG tablet Take 1 tablet (10 mg total) by mouth daily. 01/31/12   Patrecia Pour, NP  busPIRone (BUSPAR) 15 MG tablet Take 15 mg by mouth 3 (three) times daily as needed. For anxiety    [provider]  calcium citrate (CALCITRATE - DOSED IN MG ELEMENTAL CALCIUM) 950 MG tablet Take 1  tablet (200 mg of elemental calcium total) by mouth 2 (two) times daily. 01/31/12   Patrecia Pour, NP  cephALEXin (KEFLEX) 500 MG capsule Take 1 capsule (500 mg total) by mouth 2 (two) times daily. 05/14/18   Runell Kovich, Bea Graff, PA-C  Cyanocobalamin (VITAMIN B-12) 1000 MCG/15ML LIQD Take 15 mLs (1,000 mcg total) by mouth 2 (two) times a week. 01/31/12   Patrecia Pour, NP  cyanocobalamin 1000 MCG tablet Take 1 tablet (1,000 mcg total) by mouth daily. 01/31/12   Patrecia Pour, NP  divalproex (DEPAKOTE ER) 250 MG 24 hr tablet Take 1 tablet (250 mg total) by mouth 2 (two) times daily. 01/31/12   Patrecia Pour, NP  esomeprazole (NEXIUM) 40 MG capsule Take 1 capsule (40 mg total) by mouth 2 (two) times daily. 01/31/12   Patrecia Pour, NP  ferrous sulfate 325 (65 FE) MG tablet Take 1 tablet (325 mg total) by mouth 3 (three) times daily with meals. 01/31/12   Patrecia Pour, NP  HYDROcodone-acetaminophen (NORCO/VICODIN) 5-325 MG tablet Take 1 tablet by mouth every 6 (six) hours as needed for severe pain. 05/14/18   Ariann Khaimov, Bea Graff, PA-C  LORazepam (ATIVAN) 1 MG tablet Take 1 mg by mouth 4 (four) times  daily as needed. As needed for anxiety and hives    [provider]  Multiple Vitamin (MULTIVITAMIN) tablet Take 1 tablet by mouth 2 (two) times daily. 01/31/12   Patrecia Pour, NP  oxycodone (OXY-IR) 5 MG capsule Take 5 mg by mouth 3 (three) times daily as needed.    [provider]  pantoprazole (PROTONIX) 40 MG tablet Take 1 tablet (40 mg total) by mouth 2 (two) times daily before a meal. 01/31/12   Lord, Asa Saunas, NP  sulfamethoxazole-trimethoprim (BACTRIM DS,SEPTRA DS) 800-160 MG tablet Take 1 tablet by mouth 2 (two) times daily for 7 days. 05/14/18 05/21/18  Frederica Kuster, PA-C  temazepam (RESTORIL) 7.5 MG capsule Take 7.5 mg by mouth at bedtime as needed. For insomnia    [provider]  thiamine (VITAMIN B-1) 50 MG tablet Take 1 tablet (50 mg total) by mouth daily.  01/31/12   Patrecia Pour, NP  ursodiol (ACTIGALL) 500 MG tablet Take 1 tablet (500 mg total) by mouth 2 (two) times daily. 01/31/12   Patrecia Pour, NP  Vitamin D, Ergocalciferol, (DRISDOL) 50000 UNITS CAPS Take 1 capsule (50,000 Units total) by mouth 2 (two) times a week. mon & fri 01/31/12   Patrecia Pour, NP  vitamin k 100 MCG tablet Take 1 tablet (100 mcg total) by mouth daily. 01/31/12   Patrecia Pour, NP    Family History History reviewed. No pertinent family history.  Social History Social History   Tobacco Use  . Smoking status: Current Every Day Smoker  . Smokeless tobacco: Never Used  Substance Use Topics  . Alcohol use: Yes    Alcohol/week: 24.0 standard drinks    Types: 24 Cans of beer per week    Comment: Also a pint liquor  . Drug use: Not Currently     Allergies   Iodinated diagnostic agents; Penicillins; Ketorolac tromethamine; Latex; and Nsaids   Review of Systems Review of Systems  Constitutional: Negative for fever.  Skin: Positive for wound.  Neurological: Negative for numbness.     Physical Exam Updated Vital Signs BP 121/73 (BP Location: Left Arm)   Pulse (!) 107   Temp 98.3 F (36.8 C) (Oral)   Resp 20   Ht 5' 9.5" (1.765 m)   Wt 64.4 kg   LMP 01/11/2012   SpO2 96%   BMI 20.67 kg/m   Physical Exam Vitals signs and nursing note reviewed.  Constitutional:      General: She is not in acute distress.    Appearance: She is well-developed. She is not diaphoretic.  HENT:     Head: Normocephalic and atraumatic.     Mouth/Throat:     Pharynx: No oropharyngeal exudate.  Eyes:     General: No scleral icterus.       Right eye: No discharge.        Left eye: No discharge.     Conjunctiva/sclera: Conjunctivae normal.     Pupils: Pupils are equal, round, and reactive to light.  Neck:     Musculoskeletal: Normal range of motion and neck supple.     Thyroid: No thyromegaly.  Cardiovascular:     Rate and Rhythm: Regular rhythm.      Heart sounds: Normal heart sounds. No murmur. No friction rub. No gallop.   Pulmonary:     Effort: Pulmonary effort is normal. No respiratory distress.     Breath sounds: Normal breath sounds. No stridor. No wheezing or rales.  Musculoskeletal:  Comments: Empty blister just proximal to the eponychial fold of the left thumb with some surrounding erythema and dried drainage at the eponychial fold; tenderness surrounding; some tenderness extending back towards the MCP  Lymphadenopathy:     Cervical: No cervical adenopathy.  Skin:    General: Skin is warm and dry.     Coloration: Skin is not pale.     Findings: No rash.  Neurological:     Mental Status: She is alert.     Coordination: Coordination normal.      ED Treatments / Results  Labs (all labs ordered are listed, but only abnormal results are displayed) Labs Reviewed - No data to display  EKG None  Radiology No results found.  Procedures .Marland KitchenIncision and Drainage Date/Time: 05/14/2018 7:28 PM Performed by: Frederica Kuster, PA-C Authorized by: Frederica Kuster, PA-C   Consent:    Consent obtained:  Verbal   Consent given by:  Patient   Risks discussed:  Bleeding, incomplete drainage and infection   Alternatives discussed:  No treatment Location:    Type:  Abscess (paronychia)   Location:  Upper extremity   Upper extremity location:  Finger   Finger location:  L thumb Pre-procedure details:    Skin preparation:  Chloraprep Anesthesia (see MAR for exact dosages):    Anesthesia method:  Nerve block   Block location:  L thumb   Block needle gauge:  24 G   Block anesthetic:  Lidocaine 1% w/o epi   Block technique:  Digital block   Block injection procedure:  Anatomic landmarks identified, introduced needle, incremental injection, anatomic landmarks palpated and negative aspiration for blood   Block outcome:  Anesthesia achieved Procedure type:    Complexity:  Simple Procedure details:    Needle aspiration: no       Incision types:  Stab incision   Scalpel blade:  11   Wound management:  Irrigated with saline   Drainage:  Bloody   Drainage amount:  Scant   Wound treatment:  Wound left open   Packing materials:  None Post-procedure details:    Patient tolerance of procedure:  Tolerated well, no immediate complications   (including critical care time)  Medications Ordered in ED Medications  lidocaine (PF) (XYLOCAINE) 1 % injection 10 mL (10 mLs Infiltration Given 05/14/18 1758)  Tdap (BOOSTRIX) injection 0.5 mL (0.5 mLs Intramuscular Given 05/14/18 1802)     Initial Impression / Assessment and Plan / ED Course  I have reviewed the triage vital signs and the nursing notes.  Pertinent labs & imaging results that were available during my care of the patient were reviewed by me and considered in my medical decision making (see chart for details).        Patient with what appears to be paronychia and surrounding cellulitis.  Digital block conducted and I&D of paronychia without any purulent drainage, however patient states it has been draining at home.  Wound care discussed.  Will treat with Bactrim and Keflex.  Very short course of Norco given for pain control.  I reviewed the [place] narcotic database and found no discrepancies.  Warm soaks discussed.  Recheck in 2 days at PCP or return here.  Reasons to return sooner discussed.  Patient understands and agrees with plan.  Patient vital stable throughout ED course and discharged in satisfactory condition.  Final Clinical Impressions(s) / ED Diagnoses   Final diagnoses:  Paronychia of finger, left    ED Discharge Orders  Ordered    HYDROcodone-acetaminophen (NORCO/VICODIN) 5-325 MG tablet  Every 6 hours PRN     05/14/18 1817    sulfamethoxazole-trimethoprim (BACTRIM DS,SEPTRA DS) 800-160 MG tablet  2 times daily     05/14/18 1817    cephALEXin (KEFLEX) 500 MG capsule  2 times daily     05/14/18 1817           Frederica Kuster,  PA-C 05/14/18 1929    Veryl Speak, MD 05/14/18 2329

## 2018-05-14 NOTE — Discharge Instructions (Addendum)
Take Bactrim and Keflex as prescribed.  For severe pain, you can take 1 Norco every 6 hours as needed.  Use warm soaks to help continue drainage.  Please return here or see your doctor in 2 days for wound recheck.  Please return sooner if you develop any increasing pain, redness, swelling, streaking from the area.  Do not drink alcohol, drive, operate machinery or participate in any other potentially dangerous activities while taking opiate pain medication as it may make you sleepy. Do not take this medication with any other sedating medications, either prescription or over-the-counter. If you were prescribed Percocet or Vicodin, do not take these with acetaminophen (Tylenol) as it is already contained within these medications and overdose of Tylenol is dangerous.   This medication is an opiate (or narcotic) pain medication and can be habit forming.  Use it as little as possible to achieve adequate pain control.  Do not use or use it with extreme caution if you have a history of opiate abuse or dependence. This medication is intended for your use only - do not give any to anyone else and keep it in a secure place where nobody else, especially children, have access to it. It will also cause or worsen constipation, so you may want to consider taking an over-the-counter stool softener while you are taking this medication.

## 2018-05-14 NOTE — ED Triage Notes (Signed)
Reports puncture wound to left thumb last Thursday.  States tetanus status unknown.  Erythema noted to left thumb

## 2019-03-04 ENCOUNTER — Encounter: Payer: Self-pay | Admitting: *Deleted

## 2019-03-04 ENCOUNTER — Other Ambulatory Visit: Payer: Self-pay

## 2019-03-04 NOTE — Progress Notes (Signed)
Reached out to Marisa Sprinkles to introduce myself as the office RN Navigator and explain our new patient process. Reviewed the reason for their referral and scheduled their new patient appointment along with labs. Provided address and directions to the office including call back phone number. Reviewed with patient any concerns they may have or any possible barriers to attending their appointment.   Informed patient about my role as a navigator and that I will meet with them prior to their New Patient appointment and more fully discuss what services I can provide. At this time patient has no further questions or needs.   Will mail new patient information per patient request. Address updated in system per patient.

## 2019-03-11 ENCOUNTER — Other Ambulatory Visit: Payer: Self-pay | Admitting: Hematology

## 2019-03-11 DIAGNOSIS — D473 Essential (hemorrhagic) thrombocythemia: Secondary | ICD-10-CM | POA: Insufficient documentation

## 2019-03-11 DIAGNOSIS — D75839 Thrombocytosis, unspecified: Secondary | ICD-10-CM

## 2019-03-11 DIAGNOSIS — D72829 Elevated white blood cell count, unspecified: Secondary | ICD-10-CM | POA: Insufficient documentation

## 2019-03-11 DIAGNOSIS — D582 Other hemoglobinopathies: Secondary | ICD-10-CM | POA: Insufficient documentation

## 2019-03-11 DIAGNOSIS — D72825 Bandemia: Secondary | ICD-10-CM

## 2019-03-11 NOTE — Progress Notes (Signed)
[facility] NOTE  Patient Care Team: Glendon Axe, MD as PCP - General (Family Medicine) Cordelia Poche, RN as Oncology Nurse Navigator Tish Men, MD as Consulting Physician (Oncology)  HEME/ONC OVERVIEW: 1. Leukocytosis, polycythemia and thrombocytosis -Chronic thrombocytosis and intermittent leukocytosis since 2016; iron deficiency anemia  -02/2019: WBC 13.5k, Hgb 19.3 and plts 923k, normal diff  PERTINENT NON-HEM/ONC PROBLEMS: 1. Hx of iron deficiency anemia 2. Hx of polysubstance abuse 3. Hx of lung abscess s/p R thoracotomy, R lobectomy and mediastinal LN dissection in 05/2017 at [facility]  4. Jehovah's Witness  ASSESSMENT & PLAN:   Thrombocytosis -I reviewed the patient's records in detail, including external PCP clinic notes and lab studies -In summary, patient has extensive comorbidities, including bipolar disorder and history of polysubstance abuse, including ongoing EtOH abuse.  She presented to her PCP in 02/2019 for evaluation of dysuria and increased urinary frequency, associated with vaginal discharge, and urine studies were notable for gonorrhea and trichomonas.  In addition, labs showed WBC 13.5k, Hgb 19.3 and plts 923k with normal diff.  Review of CBC's at [place] showed chronic thrombocytosis with platelet count between 500 and 700k, w/ occasional 900k's.  In addition, she has had intermittent leukocytosis and iron deficiency anemia, for which she has received iron supplement in the past.  Patient is referred to hematology for further evaluation. -I reviewed the lab studies in detail with the patient -Plt count 439k today, significantly improving  -I personally reviewed the patient's peripheral blood smear today.  The red blood cells were of normal morphology.  There was no schistocytosis.  The white blood cells were of normal morphology. There were no peripheral circulating blasts. The platelets were of normal size and I verified that  there were no platelet clumping. -I discussed some of the differential diagnosis for thrombocytosis, including reactive/inflammatory, infection, and less commonly, bone marrow disorders that resulted in overproduction -Given the chronic, fluctuating thrombocytosis and the patient's extensive medical comorbidities, including history of lung abscess and substance abuse, as well as the recent STI, I suspect that the thrombocytosis is most likely reactive -I have ordered ESR, CRP, BCR/ABL FISH and MPN NGS  -If the work-up above does not show any myeloproliferative neoplasm, such as CML or essential thrombocythemia, and with the self-resolving thrombocytosis, I wouldn't recommend pursuing bone marrow biopsy or initiating treatment -I counseled the patient on the some of the concerning symptoms, including unexplained persistent fever (Tmax > 100.4), severe night sweats, or unexplained weight loss, for which she should seek further evaluation -In addition, I also counseled the patient on the importance of smoking cessation and keeping up-to-date with age-appropriate cancer screening, including MMG, colonoscopy, and pelvic exam   Elevated hemoglobin -Likely due to chronic hypoxia secondary to tobacco and cannabis abuse; hx of gastric bypass  -Hgb 12.9 today, normalized  -See the peripheral blood smear results and work-up as outlined above -In addition, I have ordered iron panel to rule out iron overload -We will monitor it for now  Leukocytosis -Likely reactive in the setting of recent infection -WBC 7.5k today, normalized  -See work-up as outlined above -We will monitor it for now  No orders of the defined types were placed in this encounter.  All questions were answered. The patient knows to call the clinic with any problems, questions or concerns.  We will call the patient with the results from today's clinic. Given the normalization of the CBC (except very mild thrombocytosis), patient can  continue follow-up with PCP unless her tests above show evidence of myeloproliferative neoplasm.   Tish Men, MD 03/12/2019 11:01 AM   CHIEF COMPLAINTS/PURPOSE OF CONSULTATION:  "I am not doing good*"  HISTORY OF PRESENTING ILLNESS:  Alicia Zamora 48 y.o. female is here because of chronic thrombocytosis and leukocytosis, as well as elevated hemoglobin.  Patient has extensive comorbidities, including bipolar disorder and history of polysubstance abuse, including ongoing EtOH abuse.  She presented to her PCP in 02/2019 for evaluation of dysuria and increased urinary frequency, associated with vaginal discharge, and urine studies were notable for gonorrhea and trichomonas.  In addition, labs showed WBC 13.5k, Hgb 19.3 and plts 923k with normal diff.  Review of CBC's at [place] showed chronic thrombocytosis with platelet count between 500 and 700k, w/ occasional 900k's.  In addition, she has had intermittent leukocytosis and iron deficiency anemia, for which she has received iron supplement in the past.  Patient was referred to hematology for further evaluation.  Patient reports a number of complaints today, including ongoing urinary symptoms, for which she was just prescribed a second course of antibiotics, but she has not yet picked up the prescription.  She also reports several weeks of swelling in the bilateral hands and feet, for which her PCP had prescribed "water pill" without significant improvement.  In addition, she reports chronic left sided chest pain, intermittent, not triggered by activity, lasting a few seconds to up to 2 minutes, with no other associated symptoms.  In addition, she also reports shortness of breath that occurs both at rest and with exertion, not associated with chest pain or other symptoms.  She has been evaluated by his PCP for these complaints in the past.  Her next PCP appointment is on 03/18/2019.  She denies any constitutional symptoms.  REVIEW OF SYSTEMS:    Constitutional: ( - ) fevers, ( - )  chills , ( - ) night sweats Eyes: ( - ) blurriness of vision, ( - ) double vision, ( - ) watery eyes Ears, nose, mouth, throat, and face: ( - ) mucositis, ( - ) sore throat Respiratory: ( - ) cough, ( + ) dyspnea, ( - ) wheezes Cardiovascular: ( - ) palpitation, ( - ) chest discomfort, ( + ) lower extremity swelling Gastrointestinal:  ( - ) nausea, ( - ) heartburn, ( - ) change in bowel habits Skin: ( - ) abnormal skin rashes Lymphatics: ( - ) new lymphadenopathy, ( - ) easy bruising Neurological: ( - ) numbness, ( - ) tingling, ( - ) new weaknesses Behavioral/Psych: ( - ) mood change, ( - ) new changes  All other systems were reviewed with the patient and are negative.  I have reviewed her chart and materials related to her cancer extensively and collaborated history with the patient. Summary of oncologic history is as follows: Oncology History   No history exists.    MEDICAL HISTORY:  Past Medical History:  Diagnosis Date  . Alcohol abuse   . Anemia    (refuses blood-[place] [facility])  . Anxiety 2004   PTSD  . Bipolar 2 disorder ([place])   . Depression 2004  . PTSD (post-traumatic stress disorder)   . Seizures ([place])     SURGICAL HISTORY: Past Surgical History:  Procedure Laterality Date  . ABDOMINAL HYSTERECTOMY    . APPENDECTOMY    . C sections  x 2   . DILATION AND CURETTAGE OF UTERUS  2008  . GASTRIC BYPASS  July 05, 2010  . HERNIA REPAIR    . LUNG LOBECTOMY Right   . TONSILLECTOMY  1974    SOCIAL HISTORY: Social History   Socioeconomic History  . Marital status: Divorced    Spouse name: Not on file  . Number of children: Not on file  . Years of education: Not on file  . Highest education level: Not on file  Occupational History  . Not on file  Tobacco Use  . Smoking status: Current Every Day Smoker    Last attempt to quit: 05/12/2017    Years since quitting: 1.8  . Smokeless tobacco: Never Used  . Tobacco comment:  Marijuana  Substance and Sexual Activity  . Alcohol use: Yes    Alcohol/week: 28.0 standard drinks    Types: 28 Cans of beer per week    Comment: Also a pint liquor  . Drug use: Yes    Types: Marijuana  . Sexual activity: Yes  Other Topics Concern  . Not on file  Social History Narrative  . Not on file   Social Determinants of Health   Financial Resource Strain:   . Difficulty of Paying Living Expenses: Not on file  Food Insecurity:   . Worried About Charity fundraiser in the Last Year: Not on file  . Ran Out of Food in the Last Year: Not on file  Transportation Needs:   . Lack of Transportation (Medical): Not on file  . Lack of Transportation (Non-Medical): Not on file  Physical Activity:   . Days of Exercise per Week: Not on file  . Minutes of Exercise per Session: Not on file  Stress:   . Feeling of Stress : Not on file  Social Connections:   . Frequency of Communication with Friends and Family: Not on file  . Frequency of Social Gatherings with Friends and Family: Not on file  . Attends Religious Services: Not on file  . Active Member of Clubs or Organizations: Not on file  . Attends Archivist Meetings: Not on file  . Marital Status: Not on file  Intimate Partner Violence:   . Fear of Current or Ex-Partner: Not on file  . Emotionally Abused: Not on file  . Physically Abused: Not on file  . Sexually Abused: Not on file    FAMILY HISTORY: History reviewed. No pertinent family history.  ALLERGIES:  is allergic to iodinated diagnostic agents; nsaids; penicillins; ketorolac tromethamine; and latex.  MEDICATIONS:  Current Outpatient Medications  Medication Sig Dispense Refill  . calcium citrate (CALCITRATE - DOSED IN MG ELEMENTAL CALCIUM) 950 MG tablet Take 1 tablet (200 mg of elemental calcium total) by mouth 2 (two) times daily. 60 tablet 0  . cyanocobalamin 1000 MCG tablet Take 1 tablet (1,000 mcg total) by mouth daily. 30 tablet 0  . ferrous sulfate  325 (65 FE) MG tablet Take 1 tablet (325 mg total) by mouth 3 (three) times daily with meals. 30 tablet 0  . Multiple Vitamin (MULTIVITAMIN) tablet Take 1 tablet by mouth 2 (two) times daily. 30 tablet 0  . oxycodone (OXY-IR) 5 MG capsule Take 5 mg by mouth 3 (three) times daily as needed.    . pantoprazole (PROTONIX) 40 MG tablet Take 1 tablet (40 mg total) by mouth 2 (two) times daily before a meal. 60 tablet 0  . thiamine (VITAMIN B-1) 50 MG tablet Take 1 tablet (50 mg total) by mouth daily. 30 tablet 0  . traZODone (DESYREL) 150 MG tablet Take  1 tablet by mouth every evening.    . Vitamin D, Ergocalciferol, (DRISDOL) 50000 UNITS CAPS Take 1 capsule (50,000 Units total) by mouth 2 (two) times a week. mon & fri 8 capsule 0  . vitamin k 100 MCG tablet Take 1 tablet (100 mcg total) by mouth daily. 30 tablet 0   No current facility-administered medications for this visit.    PHYSICAL EXAMINATION: ECOG PERFORMANCE STATUS: 2 - Symptomatic, <50% confined to bed  Vitals:   03/12/19 1006  BP: 96/66  Pulse: 67  Resp: 18  Temp: (!) 96.9 F (36.1 C)  SpO2: 100%   Filed Weights   03/12/19 1006  Weight: 158 lb 12.8 oz (72 kg)    GENERAL: alert, no distress and comfortable, older than stated age  SKIN: skin color, texture, turgor are normal, no rashes or significant lesions EYES: conjunctiva are pink and non-injected, sclera clear OROPHARYNX: no exudate, no erythema; lips, buccal mucosa, and tongue normal  NECK: supple, non-tender LYMPH:  no palpable lymphadenopathy in the cervical LUNGS: clear to auscultation with normal breathing effort HEART: regular rate & rhythm, no murmurs, 1+ bilateral lower extremity edema ABDOMEN: soft, non-tender, non-distended, normal bowel sounds Musculoskeletal: no cyanosis of digits and no clubbing  PSYCH: alert & oriented x 3, slowed speech, flat affect   LABORATORY DATA:  I have reviewed the data as listed Lab Results  Component Value Date   WBC 7.5  03/12/2019   HGB 12.6 03/12/2019   HCT 38.8 03/12/2019   MCV 96.0 03/12/2019   PLT 439 (H) 03/12/2019   Lab Results  Component Value Date   NA 141 03/12/2019   K 3.2 (L) 03/12/2019   CL 101 03/12/2019   CO2 33 (H) 03/12/2019    RADIOGRAPHIC STUDIES: I have personally reviewed the radiological images as listed and agreed with the findings in the report. No results found.  PATHOLOGY: I have reviewed the pathology reports as documented in the oncologist history.

## 2019-03-12 ENCOUNTER — Encounter: Payer: Self-pay | Admitting: *Deleted

## 2019-03-12 ENCOUNTER — Encounter: Payer: Self-pay | Admitting: Hematology

## 2019-03-12 ENCOUNTER — Other Ambulatory Visit: Payer: Self-pay

## 2019-03-12 ENCOUNTER — Encounter (INDEPENDENT_AMBULATORY_CARE_PROVIDER_SITE_OTHER): Payer: Self-pay

## 2019-03-12 ENCOUNTER — Inpatient Hospital Stay: Payer: Medicaid Other | Attending: Hematology

## 2019-03-12 ENCOUNTER — Inpatient Hospital Stay (HOSPITAL_BASED_OUTPATIENT_CLINIC_OR_DEPARTMENT_OTHER): Payer: Medicaid Other | Admitting: Hematology

## 2019-03-12 VITALS — BP 96/66 | HR 67 | Temp 96.9°F | Resp 18 | Ht 69.0 in | Wt 158.8 lb

## 2019-03-12 DIAGNOSIS — Z90710 Acquired absence of both cervix and uterus: Secondary | ICD-10-CM | POA: Diagnosis not present

## 2019-03-12 DIAGNOSIS — D72829 Elevated white blood cell count, unspecified: Secondary | ICD-10-CM

## 2019-03-12 DIAGNOSIS — Z9884 Bariatric surgery status: Secondary | ICD-10-CM

## 2019-03-12 DIAGNOSIS — D582 Other hemoglobinopathies: Secondary | ICD-10-CM

## 2019-03-12 DIAGNOSIS — D72825 Bandemia: Secondary | ICD-10-CM

## 2019-03-12 DIAGNOSIS — D75839 Thrombocytosis, unspecified: Secondary | ICD-10-CM

## 2019-03-12 DIAGNOSIS — F419 Anxiety disorder, unspecified: Secondary | ICD-10-CM

## 2019-03-12 DIAGNOSIS — Z79899 Other long term (current) drug therapy: Secondary | ICD-10-CM | POA: Diagnosis not present

## 2019-03-12 DIAGNOSIS — F3181 Bipolar II disorder: Secondary | ICD-10-CM | POA: Diagnosis not present

## 2019-03-12 DIAGNOSIS — D473 Essential (hemorrhagic) thrombocythemia: Secondary | ICD-10-CM

## 2019-03-12 DIAGNOSIS — F17210 Nicotine dependence, cigarettes, uncomplicated: Secondary | ICD-10-CM | POA: Diagnosis not present

## 2019-03-12 LAB — CBC WITH DIFFERENTIAL (CANCER CENTER ONLY)
Abs Immature Granulocytes: 0.01 10*3/uL (ref 0.00–0.07)
Basophils Absolute: 0.1 10*3/uL (ref 0.0–0.1)
Basophils Relative: 1 %
Eosinophils Absolute: 0.2 10*3/uL (ref 0.0–0.5)
Eosinophils Relative: 2 %
HCT: 38.8 % (ref 36.0–46.0)
Hemoglobin: 12.6 g/dL (ref 12.0–15.0)
Immature Granulocytes: 0 %
Lymphocytes Relative: 35 %
Lymphs Abs: 2.6 10*3/uL (ref 0.7–4.0)
MCH: 31.2 pg (ref 26.0–34.0)
MCHC: 32.5 g/dL (ref 30.0–36.0)
MCV: 96 fL (ref 80.0–100.0)
Monocytes Absolute: 0.9 10*3/uL (ref 0.1–1.0)
Monocytes Relative: 12 %
Neutro Abs: 3.8 10*3/uL (ref 1.7–7.7)
Neutrophils Relative %: 50 %
Platelet Count: 439 10*3/uL — ABNORMAL HIGH (ref 150–400)
RBC: 4.04 MIL/uL (ref 3.87–5.11)
RDW: 13.3 % (ref 11.5–15.5)
WBC Count: 7.5 10*3/uL (ref 4.0–10.5)
nRBC: 0 % (ref 0.0–0.2)

## 2019-03-12 LAB — CMP (CANCER CENTER ONLY)
ALT: 11 U/L (ref 0–44)
AST: 20 U/L (ref 15–41)
Albumin: 3.7 g/dL (ref 3.5–5.0)
Alkaline Phosphatase: 86 U/L (ref 38–126)
Anion gap: 7 (ref 5–15)
BUN: 11 mg/dL (ref 6–20)
CO2: 33 mmol/L — ABNORMAL HIGH (ref 22–32)
Calcium: 9.4 mg/dL (ref 8.9–10.3)
Chloride: 101 mmol/L (ref 98–111)
Creatinine: 0.75 mg/dL (ref 0.44–1.00)
GFR, Est AFR Am: >60 mL/min (ref >60)
GFR, Estimated: >60 mL/min (ref >60)
Glucose, Bld: 95 mg/dL (ref 70–99)
Potassium: 3.2 mmol/L — ABNORMAL LOW (ref 3.5–5.1)
Sodium: 141 mmol/L (ref 135–145)
Total Bilirubin: 0.3 mg/dL (ref 0.3–1.2)
Total Protein: 6.3 g/dL — ABNORMAL LOW (ref 6.5–8.1)

## 2019-03-12 LAB — IRON AND TIBC
Iron: 81 ug/dL (ref 41–142)
Saturation Ratios: 39 % (ref 21–57)
TIBC: 210 ug/dL — ABNORMAL LOW (ref 236–444)
UIBC: 129 ug/dL (ref 120–384)

## 2019-03-12 LAB — C-REACTIVE PROTEIN: CRP: 0.6 mg/dL (ref <1.0)

## 2019-03-12 LAB — SEDIMENTATION RATE: Sed Rate: 10 mm/hr (ref 0–22)

## 2019-03-12 LAB — FERRITIN: Ferritin: 17 ng/mL (ref 11–307)

## 2019-03-12 LAB — SAVE SMEAR(SSMR), FOR PROVIDER SLIDE REVIEW

## 2019-03-12 LAB — LACTATE DEHYDROGENASE: LDH: 152 U/L (ref 98–192)

## 2019-03-21 ENCOUNTER — Other Ambulatory Visit: Payer: Self-pay | Admitting: Hematology

## 2019-03-21 LAB — BCR ABL1 FISH (GENPATH)

## 2019-03-21 LAB — JAK2 (INCLUDING V617F AND EXON 12), MPL,& CALR W/RFL MPN PANEL (NGS)

## 2019-04-12 ENCOUNTER — Encounter: Payer: Self-pay | Admitting: Hematology

## 2020-09-11 ENCOUNTER — Other Ambulatory Visit: Payer: Self-pay

## 2020-09-11 ENCOUNTER — Emergency Department (HOSPITAL_BASED_OUTPATIENT_CLINIC_OR_DEPARTMENT_OTHER): Payer: Medicaid Other

## 2020-09-11 ENCOUNTER — Emergency Department (HOSPITAL_BASED_OUTPATIENT_CLINIC_OR_DEPARTMENT_OTHER)
Admission: EM | Admit: 2020-09-11 | Discharge: 2020-09-11 | Disposition: A | Discharge status: Home / Self Care | Payer: Medicaid Other | Attending: Emergency Medicine | Admitting: Emergency Medicine

## 2020-09-11 ENCOUNTER — Encounter (HOSPITAL_BASED_OUTPATIENT_CLINIC_OR_DEPARTMENT_OTHER): Payer: Self-pay | Admitting: *Deleted

## 2020-09-11 DIAGNOSIS — S8991XA Unspecified injury of right lower leg, initial encounter: Secondary | ICD-10-CM | POA: Diagnosis present

## 2020-09-11 DIAGNOSIS — Y92511 Restaurant or cafe as the place of occurrence of the external cause: Secondary | ICD-10-CM | POA: Insufficient documentation

## 2020-09-11 DIAGNOSIS — F17210 Nicotine dependence, cigarettes, uncomplicated: Secondary | ICD-10-CM | POA: Insufficient documentation

## 2020-09-11 DIAGNOSIS — W19XXXA Unspecified fall, initial encounter: Secondary | ICD-10-CM | POA: Insufficient documentation

## 2020-09-11 DIAGNOSIS — Z91040 Latex allergy status: Secondary | ICD-10-CM | POA: Insufficient documentation

## 2020-09-11 DIAGNOSIS — S8001XA Contusion of right knee, initial encounter: Secondary | ICD-10-CM | POA: Diagnosis not present

## 2020-09-11 MED ORDER — HYDROCODONE-ACETAMINOPHEN 5-325 MG PO TABS: 1.0000 | ORAL_TABLET | ORAL | 0 refills | Status: DC | PRN

## 2020-09-11 MED ORDER — HYDROCODONE-ACETAMINOPHEN 5-325 MG PO TABS
2.0000 | ORAL_TABLET | Freq: Once | ORAL | Status: AC
  Administered 2020-09-11: 2 via ORAL
  Filled 2020-09-11: qty 2

## 2020-09-11 NOTE — ED Triage Notes (Signed)
C/o fall at 11 this am at restaurant injuring right knee , c/o pain and swelling

## 2020-09-11 NOTE — ED Provider Notes (Signed)
[facility] EMERGENCY DEPARTMENT Provider Note   CSN: [id number] Arrival date & time: 09/11/20  1801     History Chief Complaint  Patient presents with   Alicia Zamora    Alicia Zamora is a 50 y.o. female.  The history is provided by the patient. No language interpreter was used.  Fall This is a new problem. The current episode started 1 to 2 hours ago. The problem occurs constantly. Nothing aggravates the symptoms. Nothing relieves the symptoms. She has tried nothing for the symptoms.  Pt reports she fell today and hit right knee.  Pt complains of swelling and pain.  Pt reports previous fracture to same area.      Past Medical History:  Diagnosis Date   Alcohol abuse    Anemia    (refuses blood-[employer])   Anxiety 2004   PTSD   Bipolar 2 disorder ([place])    Depression 2004   PTSD (post-traumatic stress disorder)    Seizures ([place])     Patient Active Problem List   Diagnosis Date Noted   Leukocytosis 03/11/2019   Thrombocytosis 03/11/2019   Elevated hemoglobin ([place]) 03/11/2019   Post traumatic stress disorder 01/27/2012   Iron deficiency anemia 01/25/2012   Refusal of blood transfusions as patient is Jehovah's Witness 01/25/2012   S/P gastric bypass 01/25/2012   Depression 01/25/2012   Polysubstance abuse ([place]) 01/25/2012    Past Surgical History:  Procedure Laterality Date   ABDOMINAL HYSTERECTOMY     APPENDECTOMY     C sections  x 2    DILATION AND CURETTAGE OF UTERUS  2008   GASTRIC BYPASS  July 05, 2010   HERNIA REPAIR     LUNG LOBECTOMY Right    TONSILLECTOMY  1974     OB History   No obstetric history on file.     No family history on file.  Social History   Tobacco Use   Smoking status: Every Day    Pack years: 0.00    Types: Cigarettes    Last attempt to quit: 05/12/2017    Years since quitting: 3.3   Smokeless tobacco: Never   Tobacco comments:    Marijuana  Vaping Use   Vaping Use: Former   Quit date: 05/12/2017  Substance  Use Topics   Alcohol use: Yes    Alcohol/week: 28.0 standard drinks    Types: 28 Cans of beer per week    Comment: Also a pint liquor   Drug use: Yes    Types: Marijuana    Home Medications Prior to Admission medications   Medication Sig Start Date End Date Taking? Authorizing Provider  HYDROcodone-acetaminophen (NORCO/VICODIN) 5-325 MG tablet Take 1 tablet by mouth every 4 (four) hours as needed for moderate pain. 09/11/20 09/11/21 Yes Fransico Meadow, PA-C  calcium citrate (CALCITRATE - DOSED IN MG ELEMENTAL CALCIUM) 950 MG tablet Take 1 tablet (200 mg of elemental calcium total) by mouth 2 (two) times daily. 01/31/12   Patrecia Pour, NP  cyanocobalamin 1000 MCG tablet Take 1 tablet (1,000 mcg total) by mouth daily. 01/31/12   Patrecia Pour, NP  ferrous sulfate 325 (65 FE) MG tablet Take 1 tablet (325 mg total) by mouth 3 (three) times daily with meals. 01/31/12   Patrecia Pour, NP  Multiple Vitamin (MULTIVITAMIN) tablet Take 1 tablet by mouth 2 (two) times daily. 01/31/12   Patrecia Pour, NP  oxycodone (OXY-IR) 5 MG capsule Take 5 mg by mouth 3 (three) times daily  as needed.    [provider]  pantoprazole (PROTONIX) 40 MG tablet Take 1 tablet (40 mg total) by mouth 2 (two) times daily before a meal. 01/31/12   Lord, Asa Saunas, NP  thiamine (VITAMIN B-1) 50 MG tablet Take 1 tablet (50 mg total) by mouth daily. 01/31/12   Patrecia Pour, NP  traZODone (DESYREL) 150 MG tablet Take 1 tablet by mouth every evening. 07/27/16   [provider]  Vitamin D, Ergocalciferol, (DRISDOL) 50000 UNITS CAPS Take 1 capsule (50,000 Units total) by mouth 2 (two) times a week. mon & fri 01/31/12   Patrecia Pour, NP  vitamin k 100 MCG tablet Take 1 tablet (100 mcg total) by mouth daily. 01/31/12   Patrecia Pour, NP    Allergies    Iodinated diagnostic agents, Nsaids, Penicillins, Ketorolac tromethamine, and Latex  Review of Systems   Review of Systems  All other systems  reviewed and are negative.  Physical Exam Updated Vital Signs BP 115/87   Pulse 93   Temp 98.2 F (36.8 C) (Oral)   Resp 16   Ht 5\' 8"  (1.727 m)   Wt 70.3 kg   LMP 01/11/2012   SpO2 97%   BMI 23.57 kg/m   Physical Exam Vitals and nursing note reviewed.  Constitutional:      Appearance: She is well-developed.  HENT:     Head: Normocephalic.  Cardiovascular:     Rate and Rhythm: Normal rate.  Pulmonary:     Effort: Pulmonary effort is normal.  Abdominal:     General: There is no distension.  Musculoskeletal:        General: Swelling and tenderness present. Normal range of motion.     Cervical back: Normal range of motion.  Skin:    General: Skin is warm.  Neurological:     Mental Status: She is alert and oriented to person, place, and time.  Psychiatric:        Mood and Affect: Mood normal.    ED Results / Procedures / Treatments   Labs (all labs ordered are listed, but only abnormal results are displayed) Labs Reviewed - No data to display  EKG None  Radiology DG Knee Complete 4 Views Right  Result Date: 09/11/2020 CLINICAL DATA:  Status post fall. EXAM: RIGHT KNEE - COMPLETE 4+ VIEW COMPARISON:  None. FINDINGS: No evidence of an acute fracture, dislocation, or joint effusion. Radiopaque contrast plate and screws are seen along the proximal right tibia. No evidence of arthropathy or other focal bone abnormality. Soft tissues are unremarkable. IMPRESSION: Prior ORIF of the proximal right tibia. Electronically Signed   By: Virgina Norfolk M.D.   On: 09/11/2020 19:18    Procedures Procedures   Medications Ordered in ED Medications  HYDROcodone-acetaminophen (NORCO/VICODIN) 5-325 MG per tablet 2 tablet (2 tablets Oral Given 09/11/20 2014)    ED Course  I have reviewed the triage vital signs and the nursing notes.  Pertinent labs & imaging results that were available during my care of the patient were reviewed by me and considered in my medical decision making  (see chart for details).    MDM Rules/Calculators/A&P                          MDM;  xray  no fracture.  Ace wrap to area of swelling.  Pt given crutches and advised to follow up with Orthopaedist for evaluation  Final Clinical Impression(s) / ED  Diagnoses Final diagnoses:  Contusion of right knee, initial encounter  Fall, initial encounter    Rx / DC Orders ED Discharge Orders          Ordered    HYDROcodone-acetaminophen (NORCO/VICODIN) 5-325 MG tablet  Every 4 hours PRN        09/11/20 2000          An After Visit Summary was printed and given to the patient.    Fransico Meadow, PA-C 09/11/20 [place], Sharon, DO 09/12/20 1353

## 2020-09-11 NOTE — Discharge Instructions (Addendum)
Return if any problems.

## 2020-09-12 ENCOUNTER — Telehealth (HOSPITAL_BASED_OUTPATIENT_CLINIC_OR_DEPARTMENT_OTHER): Payer: Self-pay | Admitting: Emergency Medicine

## 2020-09-13 ENCOUNTER — Telehealth (HOSPITAL_BASED_OUTPATIENT_CLINIC_OR_DEPARTMENT_OTHER): Payer: Self-pay | Admitting: Emergency Medicine

## 2020-09-13 MED ORDER — HYDROCODONE-ACETAMINOPHEN 5-325 MG PO TABS: 1.0000 | ORAL_TABLET | Freq: Four times a day (QID) | ORAL | 0 refills | Status: DC | PRN

## 2020-09-13 MED ORDER — HYDROCODONE-ACETAMINOPHEN 5-325 MG PO TABS: 1.0000 | ORAL_TABLET | Freq: Four times a day (QID) | ORAL | 0 refills | Status: AC | PRN

## 2020-09-13 NOTE — Telephone Encounter (Signed)
[facility] did not receive the medication.  Pt wants the ppx to go to [facility] [place] [facility].

## 2020-09-13 NOTE — Telephone Encounter (Signed)
The pharmacy called and reported they did not get the fax so attempting to send the medication again.

## 2021-11-04 ENCOUNTER — Other Ambulatory Visit: Payer: Self-pay

## 2021-11-04 ENCOUNTER — Emergency Department (HOSPITAL_BASED_OUTPATIENT_CLINIC_OR_DEPARTMENT_OTHER)
Admission: EM | Admit: 2021-11-04 | Discharge: 2021-11-05 | Discharge status: Against Medical Advice | Payer: Medicaid Other | Attending: Emergency Medicine | Admitting: Emergency Medicine

## 2021-11-04 ENCOUNTER — Encounter (HOSPITAL_COMMUNITY): Payer: Self-pay

## 2021-11-04 ENCOUNTER — Encounter (HOSPITAL_BASED_OUTPATIENT_CLINIC_OR_DEPARTMENT_OTHER): Payer: Self-pay

## 2021-11-04 ENCOUNTER — Emergency Department (HOSPITAL_BASED_OUTPATIENT_CLINIC_OR_DEPARTMENT_OTHER): Payer: Medicaid Other

## 2021-11-04 DIAGNOSIS — Z91040 Latex allergy status: Secondary | ICD-10-CM | POA: Insufficient documentation

## 2021-11-04 DIAGNOSIS — Z5329 Procedure and treatment not carried out because of patient's decision for other reasons: Secondary | ICD-10-CM | POA: Diagnosis not present

## 2021-11-04 DIAGNOSIS — R200 Anesthesia of skin: Secondary | ICD-10-CM | POA: Insufficient documentation

## 2021-11-04 DIAGNOSIS — R531 Weakness: Secondary | ICD-10-CM | POA: Diagnosis not present

## 2021-11-04 LAB — CBC
HCT: 38.6 % (ref 36.0–46.0)
Hemoglobin: 12.5 g/dL (ref 12.0–15.0)
MCH: 30.9 pg (ref 26.0–34.0)
MCHC: 32.4 g/dL (ref 30.0–36.0)
MCV: 95.5 fL (ref 80.0–100.0)
Platelets: 442 10*3/uL — ABNORMAL HIGH (ref 150–400)
RBC: 4.04 MIL/uL (ref 3.87–5.11)
RDW: 13 % (ref 11.5–15.5)
WBC: 9.4 10*3/uL (ref 4.0–10.5)
nRBC: 0 % (ref 0.0–0.2)

## 2021-11-04 LAB — BASIC METABOLIC PANEL
Anion gap: 6 (ref 5–15)
BUN: 16 mg/dL (ref 6–20)
CO2: 26 mmol/L (ref 22–32)
Calcium: 8.3 mg/dL — ABNORMAL LOW (ref 8.9–10.3)
Chloride: 107 mmol/L (ref 98–111)
Creatinine, Ser: 0.83 mg/dL (ref 0.44–1.00)
GFR, Estimated: >60 mL/min (ref >60)
Glucose, Bld: 91 mg/dL (ref 70–99)
Potassium: 4.1 mmol/L (ref 3.5–5.1)
Sodium: 139 mmol/L (ref 135–145)

## 2021-11-04 NOTE — ED Notes (Signed)
Patient instructed to go directly to [facility] for continued care to received MRI. Pt unsure if they will go directly there tonight, or go in the morning. RN encouraged pt to go directly there tonight. MD [facility] accepting. 20G IV removed from RAC. Pt ambulatory with steady gait to ED exit

## 2021-11-04 NOTE — ED Triage Notes (Signed)
Pt states, symptoms started last night. Hard to grasp with rt. Hand and complete numbness of arm. Grips are equal, balance is WNLS, Speech is WNLS

## 2021-11-04 NOTE — Discharge Instructions (Addendum)
Go directly to [facility] emergency room to have an MRI completed.

## 2021-11-04 NOTE — ED Provider Notes (Signed)
[facility] EMERGENCY DEPARTMENT Provider Note   CSN: [id number] Arrival date & time: 11/04/21  1900     History  Chief Complaint  Patient presents with   left arm numbness    Alicia Zamora is a 51 y.o. female.  51 year old female with no significant past medical history presents today for evaluation of sudden onset of right upper extremity numbness, as well as loss of function in her right hand.  She denies any known injury.  She does state that she had her elbow flexed in front of her and she was supporting her head when she fell asleep last night.  She woke up with the symptoms.  She denies any vision change, balance issues, speech difficulty, facial droop, paresthesias or weakness in any other extremity.  She has full function of her right shoulder, as well as right elbow.  Range of motion in the right wrist is limited.  She denies pain.  The history is provided by the patient. No language interpreter was used.       Home Medications Prior to Admission medications   Medication Sig Start Date End Date Taking? Authorizing Provider  calcium citrate (CALCITRATE - DOSED IN MG ELEMENTAL CALCIUM) 950 MG tablet Take 1 tablet (200 mg of elemental calcium total) by mouth 2 (two) times daily. 01/31/12   Patrecia Pour, NP  cyanocobalamin 1000 MCG tablet Take 1 tablet (1,000 mcg total) by mouth daily. 01/31/12   Patrecia Pour, NP  ferrous sulfate 325 (65 FE) MG tablet Take 1 tablet (325 mg total) by mouth 3 (three) times daily with meals. 01/31/12   Patrecia Pour, NP  HYDROcodone-acetaminophen (NORCO/VICODIN) 5-325 MG tablet Take 1 tablet by mouth every 6 (six) hours as needed for severe pain. 09/13/20   Blanchie Dessert, MD  Multiple Vitamin (MULTIVITAMIN) tablet Take 1 tablet by mouth 2 (two) times daily. 01/31/12   Patrecia Pour, NP  oxycodone (OXY-IR) 5 MG capsule Take 5 mg by mouth 3 (three) times daily as needed.    [provider]  pantoprazole (PROTONIX) 40  MG tablet Take 1 tablet (40 mg total) by mouth 2 (two) times daily before a meal. 01/31/12   Lord, Asa Saunas, NP  thiamine (VITAMIN B-1) 50 MG tablet Take 1 tablet (50 mg total) by mouth daily. 01/31/12   Patrecia Pour, NP  traZODone (DESYREL) 150 MG tablet Take 1 tablet by mouth every evening. 07/27/16   [provider]  Vitamin D, Ergocalciferol, (DRISDOL) 50000 UNITS CAPS Take 1 capsule (50,000 Units total) by mouth 2 (two) times a week. mon & fri 01/31/12   Patrecia Pour, NP  vitamin k 100 MCG tablet Take 1 tablet (100 mcg total) by mouth daily. 01/31/12   Patrecia Pour, NP      Allergies    Iodinated contrast media, Nsaids, Penicillins, Ketorolac tromethamine, and Latex    Review of Systems   Review of Systems  Eyes:  Negative for visual disturbance.  Respiratory:  Negative for shortness of breath.   Neurological:  Positive for weakness and numbness. Negative for syncope, speech difficulty, light-headedness and headaches.  All other systems reviewed and are negative.   Physical Exam Updated Vital Signs BP 132/84 (BP Location: Right Arm)   Pulse 84   Temp 98.7 F (37.1 C) (Oral)   Resp 18   Ht '5\' 9"'$  (1.753 m)   Wt 66.7 kg   LMP 01/11/2012   SpO2 100%   BMI 21.71 kg/m  Physical Exam Vitals and nursing note reviewed.  Constitutional:      General: She is not in acute distress.    Appearance: Normal appearance. She is not ill-appearing.  HENT:     Head: Normocephalic and atraumatic.     Nose: Nose normal.  Eyes:     General: No scleral icterus.    Extraocular Movements: Extraocular movements intact.     Conjunctiva/sclera: Conjunctivae normal.  Cardiovascular:     Rate and Rhythm: Normal rate and regular rhythm.     Pulses: Normal pulses.  Pulmonary:     Effort: Pulmonary effort is normal. No respiratory distress.     Breath sounds: Normal breath sounds. No wheezing or rales.  Abdominal:     General: There is no distension.     Tenderness: There is no  abdominal tenderness.  Musculoskeletal:        General: Normal range of motion.     Cervical back: Normal range of motion.  Skin:    General: Skin is warm and dry.  Neurological:     General: No focal deficit present.     Mental Status: She is alert. Mental status is at baseline.     Comments: Cranial nerves III through XII intact.  Decreased sensation over the right forearm, and the right hand.  Patient has limited range of motion in the right wrist.  She is able to fully flex however she is not able to fully extend the wrist, or fully extend all the digits of the right hand or form a tight grip.  She has full range of motion of the right elbow, right shoulder.  Sensation intact in bilateral lower extremities.  Without facial droop, or tongue deviation.  5/5 strength in bilateral lower extremities.     ED Results / Procedures / Treatments   Labs (all labs ordered are listed, but only abnormal results are displayed) Labs Reviewed  CBC  BASIC METABOLIC PANEL    EKG None  Radiology No results found.  Procedures Procedures    Medications Ordered in ED Medications - No data to display  ED Course/ Medical Decision Making/ A&P                           Medical Decision Making Amount and/or Complexity of Data Reviewed Labs: ordered. Radiology: ordered.   Medical Decision Making / ED Course   This patient presents to the ED for concern of numbness, and weakness of the right upper extremity., this involves an extensive number of treatment options, and is a complaint that carries with it a high risk of complications and morbidity.  The differential diagnosis includes CVA, Saturday night palsy  MDM: 51 year old female with no significant past medical history presents today for evaluation of sudden onset of right upper extremity numbness as well as loss of function in the right hand.  Patient woke up with the symptoms.  She denies any known injury.  She states she slept in a way  where she had her head resting on her arm. CT head without acute intracranial finding.  CBC, BMP unremarkable.  Case discussed with teleneurologist who states this could be CVA versus Saturday night palsy.  Recommend MRI brain and C-spine.  If these are normal patient is appropriate for supportive management for Saturday night palsy and follow-up with outpatient neurology.  If MRI is abnormal neurology can be consulted.  Case was discussed with Dr. Darl Householder who is in agreement. Discussed  with Dr. Marlena Clipper at [facility] emergency room who accepts patient for transfer.  Patient is in agreement with plan to go to [facility] to receive the MRI.   Lab Tests: -I ordered, reviewed, and interpreted labs.   The pertinent results include:   Labs Reviewed  CBC - Abnormal; Notable for the following components:      Result Value   Platelets 442 (*)    All other components within normal limits  BASIC METABOLIC PANEL - Abnormal; Notable for the following components:   Calcium 8.3 (*)    All other components within normal limits      EKG  EKG Interpretation  Date/Time:  Thursday November 04 2021 19:18:10 EDT Ventricular Rate:  76 PR Interval:  143 QRS Duration: 85 QT Interval:  404 QTC Calculation: 455 R Axis:   33 Text Interpretation: Sinus rhythm No significant change since last tracing Confirmed by Wandra Arthurs [phone]) on 11/04/2021 11:11:47 PM         Imaging Studies ordered: I ordered imaging studies including CT head I independently visualized and interpreted imaging. I agree with the radiologist interpretation   Medicines ordered and prescription drug management: No orders of the defined types were placed in this encounter.   -I have reviewed the patients home medicines and have made adjustments as needed  Consultations Obtained: I requested consultation with the teleneurologist,  and discussed lab and imaging findings as well as pertinent plan - they recommend: As  above   Reevaluation: After the interventions noted above, I reevaluated the patient and found that they have :stayed the same  Co morbidities that complicate the patient evaluation  Past Medical History:  Diagnosis Date   Alcohol abuse    Anemia    (refuses blood-[employer] Witness)   Anxiety 2004   PTSD   Bipolar 2 disorder ([place])    Depression 2004   PTSD (post-traumatic stress disorder)    Seizures ([place])       Dispostion: Patient transferred to [facility] emergency room via private vehicle for further work-up with MRI.   Final Clinical Impression(s) / ED Diagnoses Final diagnoses:  Right upper extremity numbness    Rx / DC Orders ED Discharge Orders     None         Evlyn Courier, PA-C 11/04/21 2350    Drenda Freeze, MD 11/11/21 1626

## 2021-11-05 ENCOUNTER — Other Ambulatory Visit: Payer: Self-pay

## 2021-11-05 ENCOUNTER — Encounter (HOSPITAL_COMMUNITY): Payer: Self-pay

## 2021-11-05 ENCOUNTER — Emergency Department (HOSPITAL_COMMUNITY)
Admission: EM | Admit: 2021-11-05 | Discharge: 2021-11-05 | Disposition: A | Discharge status: Home / Self Care | Payer: Medicaid Other | Source: Home / Self Care

## 2021-11-05 DIAGNOSIS — Z5321 Procedure and treatment not carried out due to patient leaving prior to being seen by health care provider: Secondary | ICD-10-CM | POA: Insufficient documentation

## 2021-11-05 DIAGNOSIS — R200 Anesthesia of skin: Secondary | ICD-10-CM | POA: Insufficient documentation

## 2021-11-05 NOTE — ED Triage Notes (Addendum)
Pt arrived POV from home c/o left arm numbness. Pt was seen at [place] yesterday and told to come to get an MRI to rule out a stroke. Pt denies any other stroke symptoms. Pt states she woke up like this yesterday but unsure of what time she went to bed the night before for a LKW.

## 2021-11-05 NOTE — ED Provider Triage Note (Signed)
Emergency Medicine Provider Triage Evaluation Note  Alicia Zamora , a 51 y.o. female  was evaluated in triage.  Pt complains of right arm numbness onset 2 days.  Was evaluated at [facility] had a negative CT at that time.  Was told to come into the emergency department via POV to [facility] for MRI to rule out stroke versus Saturday night palsy.  Patient returns today for follow-up imaging.  Review of Systems  Positive: As per HPI Negative:   Physical Exam  BP 111/73 (BP Location: Left Arm)   Pulse 94   Temp 99.1 F (37.3 C) (Oral)   Resp 16   Ht '5\' 9"'$  (1.753 m)   Wt 66.7 kg   LMP 01/11/2012   SpO2 100%   BMI 21.71 kg/m  Gen:   Awake, no distress   Resp:  Normal effort  MSK:   Moves extremities without difficulty  Other:  Decree sensation noted to right forearm and right hand.  Limited range of motion in right wrist.  Patient is able to flex wrist however unable to fully extend wrist or digits of the right hand.  Also unable to form a tight grip.  Full range of motion of remainder of bilateral upper extremities.  Strength sensation intact to bilateral lower extremities.  Able to ambulate without assistance or difficulty.  Medical Decision Making  Medically screening exam initiated at 1:50 PM.  Appropriate orders placed.  Besan Ketchem was informed that the remainder of the evaluation will be completed by another provider, this initial triage assessment does not replace that evaluation, and the importance of remaining in the ED until their evaluation is complete.  MRI ordered.  Work-up initiated.   Kass Herberger A, PA-C 11/05/21 1357

## 2021-11-05 NOTE — ED Notes (Signed)
This patient's support person informed this EMT that the patient did not want to wait to be seen. This EMT encouraged the patient to stay and be seen by a provider, but that if she did decide to leave and her symptoms worsen to seek medical attention.

## 2022-03-21 IMAGING — CR DG KNEE COMPLETE 4+V*R*
4 series · 4 of 4 positions shown · non-contrast
Comparison: None.

CLINICAL DATA: Status post fall.

EXAM:
RIGHT KNEE - COMPLETE 4+ VIEW

[t knee oblique right (1 of 2)]
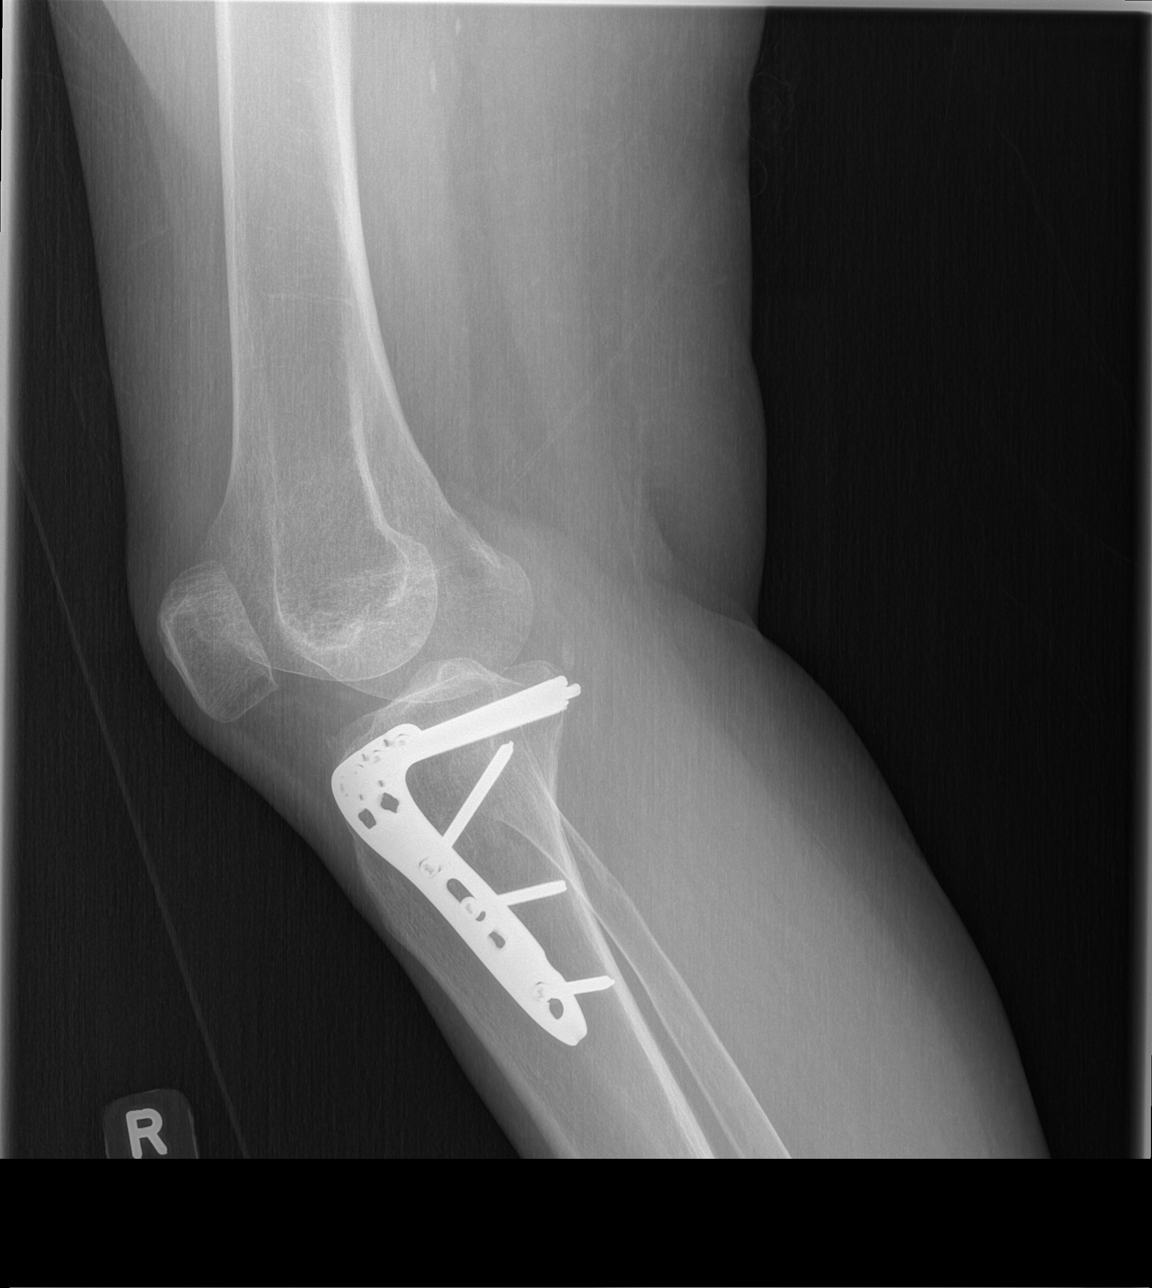

[t knee ap right]
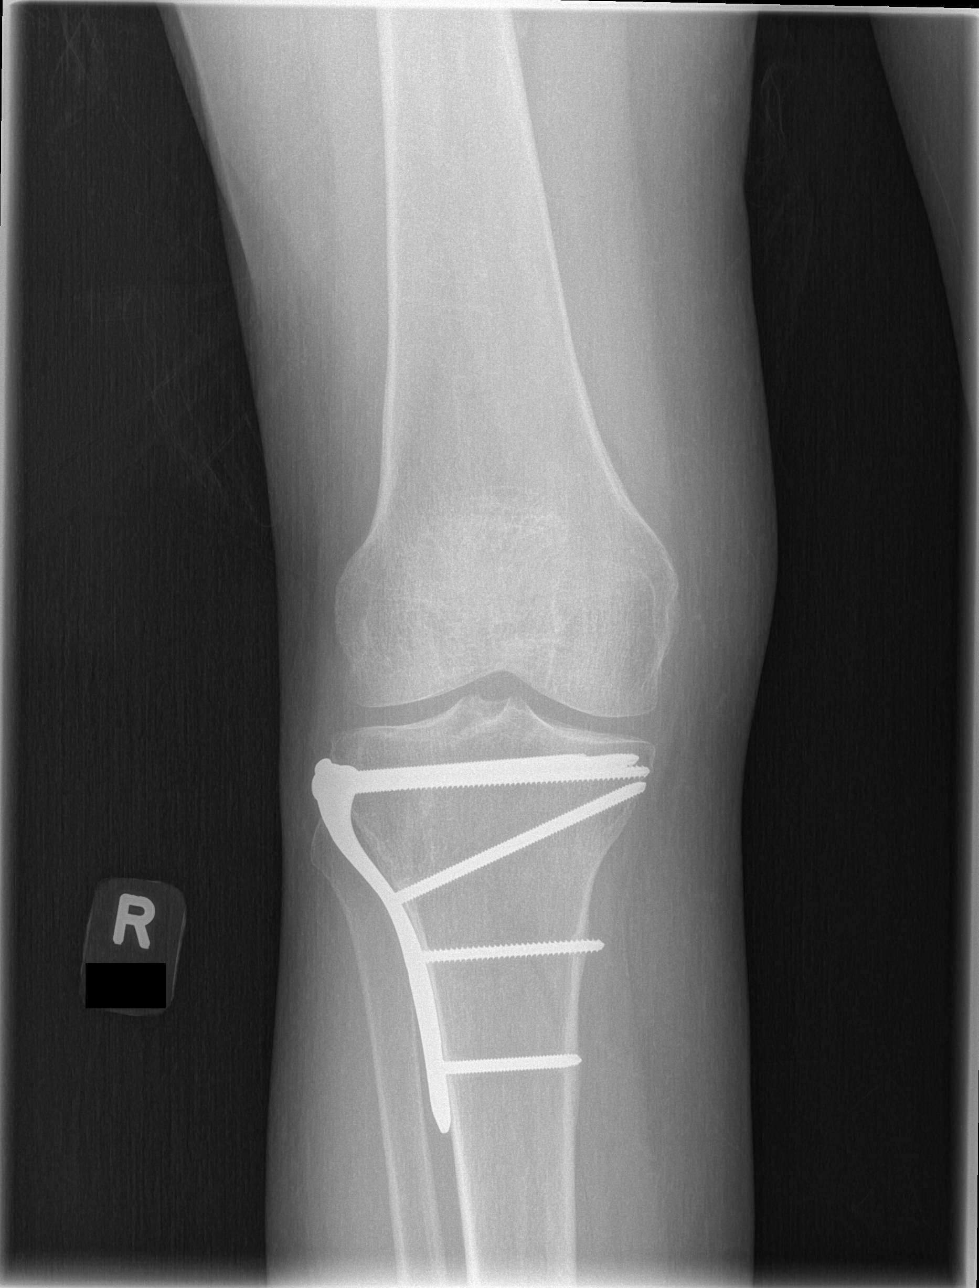

[t knee oblique right (2 of 2)]
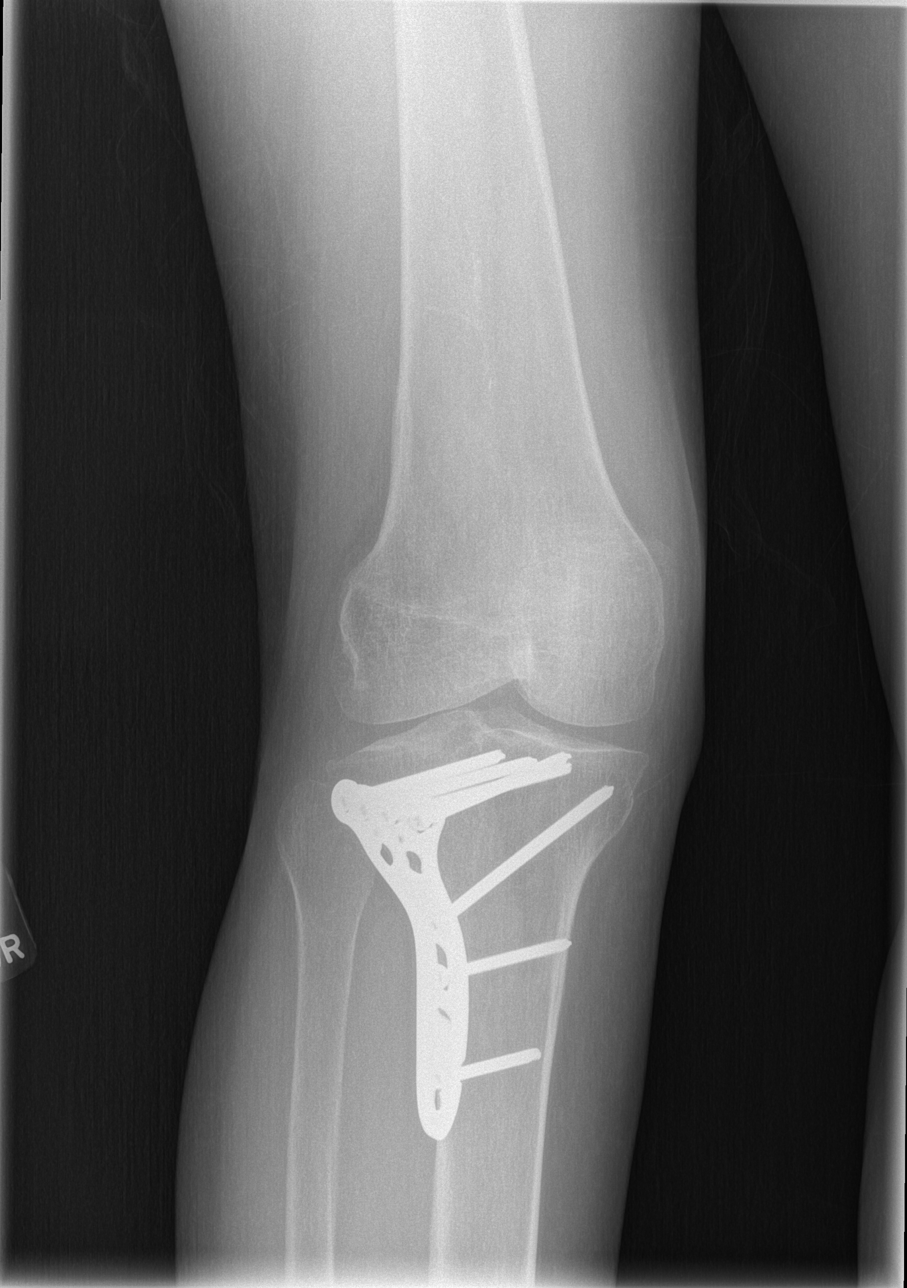

[t knee lat right]
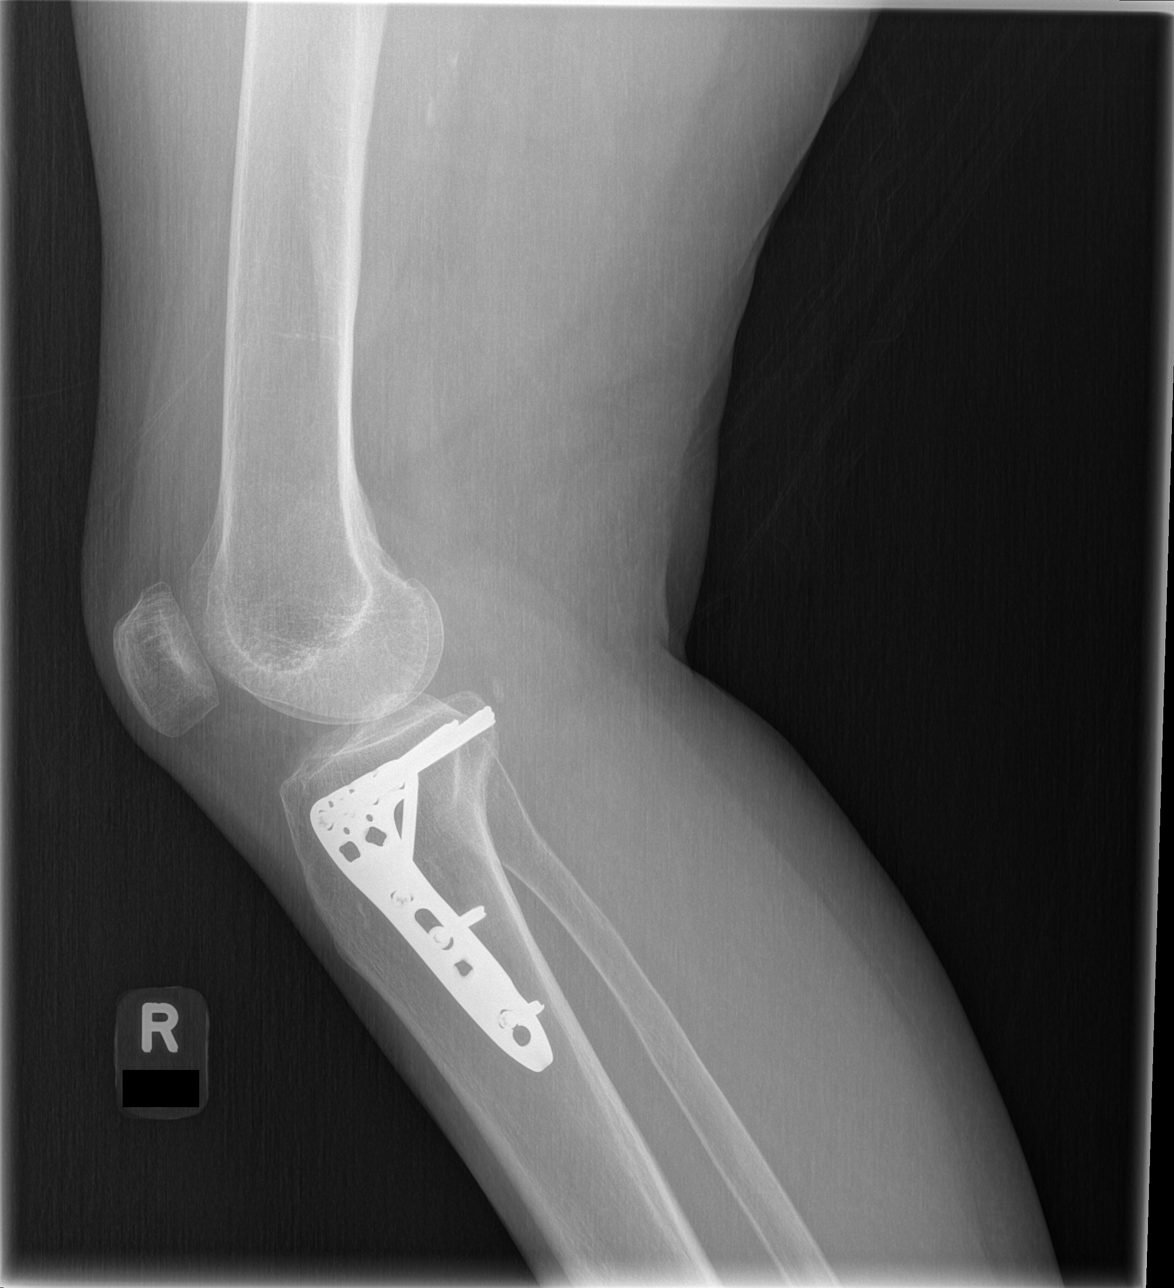

[4 of 4 positions shown; findings below may reference images not displayed]

FINDINGS: No evidence of an acute fracture, dislocation, or joint effusion.
Radiopaque contrast plate and screws are seen along the proximal
right tibia. No evidence of arthropathy or other focal bone
abnormality. Soft tissues are unremarkable.
IMPRESSION: Prior ORIF of the proximal right tibia.

## 2023-04-15 DEATH — deceased
